# Patient Record
Sex: Male | Born: 1980 | Race: Black or African American | Hispanic: No | Marital: Married | Smoking: Never smoker
Health system: Southern US, Community
[De-identification: ages and names within clinical notes are randomized; demographics above are authoritative.]

## PROBLEM LIST (undated history)

## (undated) DIAGNOSIS — Q6602 Congenital talipes equinovarus, left foot: Secondary | ICD-10-CM

## (undated) DIAGNOSIS — H3320 Serous retinal detachment, unspecified eye: Secondary | ICD-10-CM

## (undated) DIAGNOSIS — Q6601 Congenital talipes equinovarus, right foot: Secondary | ICD-10-CM

## (undated) DIAGNOSIS — K219 Gastro-esophageal reflux disease without esophagitis: Secondary | ICD-10-CM

## (undated) DIAGNOSIS — Q6689 Other  specified congenital deformities of feet: Secondary | ICD-10-CM

## (undated) DIAGNOSIS — R42 Dizziness and giddiness: Secondary | ICD-10-CM

## (undated) HISTORY — PX: EYE SURGERY: SHX253

## (undated) HISTORY — PX: FOOT SURGERY: SHX648

## (undated) HISTORY — DX: Gastro-esophageal reflux disease without esophagitis: K21.9

---

## 2008-09-02 ENCOUNTER — Ambulatory Visit: Payer: Self-pay | Admitting: Gastroenterology

## 2011-01-06 ENCOUNTER — Emergency Department: Payer: Self-pay | Admitting: Emergency Medicine

## 2013-09-30 ENCOUNTER — Ambulatory Visit: Payer: Self-pay | Admitting: Family Medicine

## 2013-10-08 ENCOUNTER — Ambulatory Visit: Payer: Self-pay

## 2014-09-08 ENCOUNTER — Encounter: Payer: Self-pay | Admitting: Emergency Medicine

## 2014-09-08 ENCOUNTER — Ambulatory Visit
Admission: EM | Admit: 2014-09-08 | Discharge: 2014-09-08 | Disposition: A | Discharge status: Home / Self Care | Payer: BLUE CROSS/BLUE SHIELD | Attending: Family Medicine | Admitting: Family Medicine

## 2014-09-08 DIAGNOSIS — K047 Periapical abscess without sinus: Secondary | ICD-10-CM | POA: Diagnosis not present

## 2014-09-08 HISTORY — DX: Serous retinal detachment, unspecified eye: H33.20

## 2014-09-08 MED ORDER — AMOXICILLIN 500 MG PO CAPS: 500.0000 mg | ORAL_CAPSULE | Freq: Three times a day (TID) | ORAL | Status: DC

## 2014-09-08 NOTE — ED Provider Notes (Addendum)
CSN: [id number]     Arrival date & time 09/08/14  1401 History   First MD Initiated Contact with Patient 09/08/14 1432     Chief Complaint  Patient presents with  . Facial Swelling    right ear  . Otalgia   (Consider location/radiation/quality/duration/timing/severity/associated sxs/prior Treatment) HPI  Last week noticed R lower wisdom tooth pain, still somewhat painful, 3d ago noted swelling at base of earlobe, better today but now pain and swelling at R jaw angle. No fever or trouble swallowing. Past Medical History  Diagnosis Date  . Detached retina     right eye   Past Surgical History  Procedure Laterality Date  . Eye surgery     History reviewed. No pertinent family history. History  Substance Use Topics  . Smoking status: Never Smoker   . Smokeless tobacco: Never Used  . Alcohol Use: No    Review of Systems  Allergies  Review of patient's allergies indicates no known allergies.  Home Medications   Prior to Admission medications   Medication Sig Start Date End Date Taking? Authorizing Provider  omeprazole (PRILOSEC) 20 MG capsule Take 20 mg by mouth daily.   Yes Historical Provider, MD  amoxicillin (AMOXIL) 500 MG capsule Take 1 capsule (500 mg total) by mouth 3 (three) times daily. 09/08/14   Schuyler Amor, MD   BP 138/77 mmHg  Pulse 77  Temp(Src) 97.5 F (36.4 C) (Tympanic)  Resp 16  Ht  (1.753 m)  Wt 205 lb (92.987 kg)  BMI 30.26 kg/m2  SpO2 99% Physical Exam  Constitutional: He appears well-developed and well-nourished. No distress.  HENT:  Head: Normocephalic and atraumatic.  Right Ear: External ear normal.  Mouth/Throat: Oropharynx is clear and moist.  Moderately tender adenopathy R jaw angle, minimal edema, R lower molar with deep carie  Neck: Neck supple.  Skin: He is not diaphoretic.    ED Course  Procedures (including critical care time) Labs Review Labs Reviewed - No data to display  Imaging Review No results found.   MDM    1. Dental infection    Amox  tid x 10 days, needs to see dentist for definitive treatment, call if sxs worsen or unimproved in 24 hrs.  Schuyler Amor, MD 09/08/14 1443  Schuyler Amor, MD 09/08/14 [phone]

## 2014-09-08 NOTE — ED Notes (Signed)
Patient c/o right ear swelling and pain on the outside and behind his right ear for the past couple of days.

## 2014-09-08 NOTE — Discharge Instructions (Signed)

## 2015-06-03 ENCOUNTER — Ambulatory Visit
Admission: EM | Admit: 2015-06-03 | Discharge: 2015-06-03 | Disposition: A | Discharge status: Home / Self Care | Attending: Family Medicine | Admitting: Family Medicine

## 2015-06-03 ENCOUNTER — Encounter: Payer: Self-pay | Admitting: *Deleted

## 2015-06-03 DIAGNOSIS — S39012A Strain of muscle, fascia and tendon of lower back, initial encounter: Secondary | ICD-10-CM

## 2015-06-03 MED ORDER — METAXALONE 800 MG PO TABS: 800.0000 mg | ORAL_TABLET | Freq: Three times a day (TID) | ORAL | Status: DC

## 2015-06-03 MED ORDER — NAPROXEN 500 MG PO TABS: 500.0000 mg | ORAL_TABLET | Freq: Two times a day (BID) | ORAL | Status: DC

## 2015-06-03 NOTE — ED Provider Notes (Signed)
CSN: [id number]     Arrival date & time 06/03/15  1554 History   First MD Initiated Contact with Patient 06/03/15 1707     Chief Complaint  Patient presents with  . 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 He states that he had a heavy box in his posterior bag and as he was approaching the delivery destination twisted to his right and extended his back slightly when he felt a pull in his back. He continued to work but as he did he notice the pain became worse and started to radiate into his right buttock and proximal right thigh. He also had some pain feeling in the left lower back over the top of his buttock. There was no radiation of the left side. He has not expressed any numbness or tingling into his feet and he denies any incontinence. He states that he has hurt his back in the past but always improved without any intervention..     Past Medical History  Diagnosis Date  . Detached retina     right eye   Past Surgical History  Procedure Laterality Date  . Eye surgery     History reviewed. No pertinent family history. Social History  Substance Use Topics  . Smoking status: Never Smoker   . Smokeless tobacco: Never Used  . Alcohol Use: No    Review of Systems  Constitutional: Positive for activity change. Negative for fever, chills, appetite change and fatigue.  Musculoskeletal: Positive for myalgias, back pain and gait problem.    Allergies  Review of patient's allergies indicates no known allergies.  Home Medications   Prior to Admission medications   Medication Sig Start Date End Date Taking? Authorizing Provider  amoxicillin (AMOXIL) 500 MG capsule Take 1 capsule (500 mg total) by mouth 3 (three) times daily. 09/08/14   Schuyler Amor, MD  metaxalone (SKELAXIN) 800 MG tablet Take 1 tablet (800 mg total)  by mouth 3 (three) times daily. 06/03/15   Lutricia Feil, PA-C  naproxen (NAPROSYN) 500 MG tablet Take 1 tablet (500 mg total) by mouth 2 (two) times daily with a meal. 06/03/15   Lutricia Feil, PA-C  omeprazole (PRILOSEC) 20 MG capsule Take 20 mg by mouth daily.    Historical Provider, MD   Meds Ordered and Administered this Visit  Medications - No data to display  BP 128/82 mmHg  Pulse 90  Temp(Src) 98.4 F (36.9 C) (Oral)  Resp 18  Ht  (1.753 m)  Wt 195 lb (88.451 kg)  BMI 28.78 kg/m2  SpO2 98% No data found.   Physical Exam  Constitutional: He is oriented to person, place, and time. He appears well-developed and well-nourished. No distress.  HENT:  Head: Normocephalic and atraumatic.  Eyes: Conjunctivae are normal. Pupils are equal, round, and reactive to light.  Neck: Normal range of motion. Neck supple.  Musculoskeletal: Normal range of motion. He exhibits tenderness. He exhibits no edema.  Examination of the back shows a pelvic obliquity to the right. Is able to forward flex fully and lateral flex fully but with the discomfort on the right lower lumbar/sacral segments. Is able to toe and heel walk adequately. His previous left foot repair showing some disequilibrium because of that. Sensation shows a slight hip esthesia in a stocking distribution on the right. EHL peroneal and anterior tibialis are all strong to clinical  testing. Straight leg raise testing is positive at 90 on the right with low back pain.  Lymphadenopathy:    He has no cervical adenopathy.  Neurological: He is alert and oriented to person, place, and time.  Skin: Skin is warm and dry. He is not diaphoretic.  Psychiatric: He has a normal mood and affect. His behavior is normal. Judgment and thought content normal.  Nursing note and vitals reviewed.   ED Course  Procedures (including critical care time)  Labs Review Labs Reviewed - No data to display  Imaging Review No results  found.   Visual Acuity Review  Right Eye Distance:   Left Eye Distance:   Bilateral Distance:    Right Eye Near:   Left Eye Near:    Bilateral Near:         MDM   1. Strain of lumbar paraspinal muscle, initial encounter    Discharge Medication List as of 06/03/2015  5:37 PM    START taking these medications   Details  metaxalone (SKELAXIN) 800 MG tablet Take 1 tablet (800 mg total) by mouth 3 (three) times daily., Starting 06/03/2015, Until Discontinued, Normal    naproxen (NAPROSYN) 500 MG tablet Take 1 tablet (500 mg total) by mouth 2 (two) times daily with a meal., Starting 06/03/2015, Until Discontinued, Normal      Plan: 1. Test/x-ray results and diagnosis reviewed with patient 2. rx as per orders; risks, benefits, potential side effects reviewed with patient 3. Recommend supportive treatment with Symptom avoidance and rest as necessary. We'll start him on Naprosyn and Skelaxin. The Skelaxin may cause drowsiness I've cautioned him regarding activities requiring judgment and concentration. He should not drive while taking Skelaxin. He will take these well home and time. Follow-up for continuing problems will be with Lynnell Chadommie Ann Moore,FNP [facility]. Given him restrictions at work for 1 week duration of the sitting lifting or bending avoidance. He should not lift greater than 10 pounds. Have offered him aToradol injection which he refused. 4. F/u prn if symptoms worsen or don't improve     Lutricia FeilWilliam P Samrat Hayward, PA-C 06/03/15 1752

## 2015-06-03 NOTE — ED Notes (Signed)
Patient injured his back this AM @ 1100 while at work. Patient was lifting a box and twisted his back causing some pain in his lower on the right side. As the workday progressed the back pain became worse and started hurting on the left side.

## 2015-06-03 NOTE — Discharge Instructions (Signed)
Back Injury Prevention °Back injuries can be very painful. They can also be difficult to heal. After having one back injury, you are more likely to injure your back again. It is important to learn how to avoid injuring or re-injuring your back. The following tips can help you to prevent a back injury. °WHAT SHOULD I KNOW ABOUT PHYSICAL FITNESS? °· Exercise for 30 minutes per day on most days of the week or as directed by your health care provider. Make sure to: °· Do aerobic exercises, such as walking, jogging, biking, or swimming. °· Do exercises that increase balance and strength, such as tai chi and yoga. These can decrease your risk of falling and injuring your back. °· Do stretching exercises to help with flexibility. °· Try to develop strong abdominal muscles. Your abdominal muscles provide a lot of the support that is needed by your back. °· Maintain a healthy weight.  This helps to decrease your risk of a back injury. °WHAT SHOULD I KNOW ABOUT MY DIET? °· Talk with your health care provider about your overall diet. Take supplements and vitamins only as directed by your health care provider. °· Talk with your health care provider about how much calcium and vitamin D you need each day. These nutrients help to prevent weakening of the bones (osteoporosis). Osteoporosis can cause broken (fractured) bones, which lead to back pain. °· Include good sources of calcium in your diet, such as dairy products, green leafy vegetables, and products that have had calcium added to them (fortified). °· Include good sources of vitamin D in your diet, such as milk and foods that are fortified with vitamin D. °WHAT SHOULD I KNOW ABOUT MY POSTURE? °· Sit up straight and stand up straight. Avoid leaning forward when you sit or hunching over when you stand. °· Choose chairs that have good low-back (lumbar) support. °· If you work at a desk, sit close to it so you do not need to lean over. Keep your chin tucked in. Keep your neck  drawn back, and keep your elbows bent at a right angle. Your arms should look like the letter "L." °· Sit high and close to the steering wheel when you drive. Add a lumbar support to your car seat, if needed. °· Avoid sitting or standing in one position for very long. Take breaks to get up, stretch, and walk around at least one time every hour. Take breaks every hour if you are driving for long periods of time. °· Sleep on your side with your knees slightly bent, or sleep on your back with a pillow under your knees. Do not lie on the front of your body to sleep. °WHAT SHOULD I KNOW ABOUT LIFTING, TWISTING, AND REACHING? °Lifting and Heavy Lifting °· Avoid heavy lifting, especially repetitive heavy lifting. If you must do heavy lifting: °· Stretch before lifting. °· Work slowly. °· Rest between lifts. °· Use a tool such as a cart or a dolly to move objects if one is available. °· Make several small trips instead of carrying one heavy load. °· Ask for help when you need it, especially when moving big objects. °· Follow these steps when lifting: °· Stand with your feet shoulder-width apart. °· Get as close to the object as you can. Do not try to pick up a heavy object that is far from your body. °· Use handles or lifting straps if they are available. °· Bend at your knees. Squat down, but keep your heels off the floor. °·   Keep your shoulders pulled back, your chin tucked in, and your back straight. °· Lift the object slowly while you tighten the muscles in your legs, abdomen, and buttocks. Keep the object as close to the center of your body as possible. °· Follow these steps when putting down a heavy load: °· Stand with your feet shoulder-width apart. °· Lower the object slowly while you tighten the muscles in your legs, abdomen, and buttocks. Keep the object as close to the center of your body as possible. °· Keep your shoulders pulled back, your chin tucked in, and your back straight. °· Bend at your knees. Squat  down, but keep your heels off the floor. °· Use handles or lifting straps if they are available. °Twisting and Reaching °· Avoid lifting heavy objects above your waist. °· Do not twist at your waist while you are lifting or carrying a load. If you need to turn, move your feet. °· Do not bend over without bending at your knees. °· Avoid reaching over your head, across a table, or for an object on a high surface. °WHAT ARE SOME OTHER TIPS? °· Avoid wet floors and icy ground. Keep sidewalks clear of ice to prevent falls. °· Do not sleep on a mattress that is too soft or too hard. °· Keep items that are used frequently within easy reach. °· Put heavier objects on shelves at waist level, and put lighter objects on lower or higher shelves. °· Find ways to decrease your stress, such as exercise, massage, or relaxation techniques. Stress can build up in your muscles. Tense muscles are more vulnerable to injury. °· Talk with your health care provider if you feel anxious or depressed. These conditions can make back pain worse. °· Wear flat heel shoes with cushioned soles. °· Avoid sudden movements. °· Use both shoulder straps when carrying a backpack. °· Do not use any tobacco products, including cigarettes, chewing tobacco, or electronic cigarettes. If you need help quitting, ask your health care provider. °  °This information is not intended to replace advice given to you by your health care provider. Make sure you discuss any questions you have with your health care provider. °  °Document Released: 03/02/2004 Document Revised: 06/09/2014 Document Reviewed: 01/27/2014 °Elsevier Interactive Patient Education ©2016 [employer] ° °Lumbosacral Strain °Lumbosacral strain is a strain of any of the parts that make up your lumbosacral vertebrae. Your lumbosacral vertebrae are the bones that make up the lower third of your backbone. Your lumbosacral vertebrae are held together by muscles and tough, fibrous tissue (ligaments).    °CAUSES  °A sudden blow to your back can cause lumbosacral strain. Also, anything that causes an excessive stretch of the muscles in the low back can cause this strain. This is typically seen when people exert themselves strenuously, fall, lift heavy objects, bend, or crouch repeatedly. °RISK FACTORS °· Physically demanding work. °· Participation in pushing or pulling sports or sports that require a sudden twist of the back (tennis, golf, baseball). °· Weight lifting. °· Excessive lower back curvature. °· Forward-tilted pelvis. °· Weak back or abdominal muscles or both. °· Tight hamstrings. °SIGNS AND SYMPTOMS  °Lumbosacral strain may cause pain in the area of your injury or pain that moves (radiates) down your leg.  °DIAGNOSIS °Your health care provider can often diagnose lumbosacral strain through a physical exam. In some cases, you may need tests such as X-ray exams.  °TREATMENT  °Treatment for your lower back injury depends on many factors that   your clinician will have to evaluate. However, most treatment will include the use of anti-inflammatory medicines. °HOME CARE INSTRUCTIONS  °· Avoid hard physical activities (tennis, racquetball, waterskiing) if you are not in proper physical condition for it. This may aggravate or create problems. °· If you have a back problem, avoid sports requiring sudden body movements. Swimming and walking are generally safer activities. °· Maintain good posture. °· Maintain a healthy weight. °· For acute conditions, you may put ice on the injured area. °¨ Put ice in a plastic bag. °¨ Place a towel between your skin and the bag. °¨ Leave the ice on for 20 minutes, 2-3 times a day. °· When the low back starts healing, stretching and strengthening exercises may be recommended. °SEEK MEDICAL CARE IF: °· Your back pain is getting worse. °· You experience severe back pain not relieved with medicines. °SEEK IMMEDIATE MEDICAL CARE IF:  °· You have numbness, tingling, weakness, or problems  with the use of your arms or legs. °· There is a change in bowel or bladder control. °· You have increasing pain in any area of the body, including your belly (abdomen). °· You notice shortness of breath, dizziness, or feel faint. °· You feel sick to your stomach (nauseous), are throwing up (vomiting), or become sweaty. °· You notice discoloration of your toes or legs, or your feet get very cold. °MAKE SURE YOU:  °· Understand these instructions. °· Will watch your condition. °· Will get help right away if you are not doing well or get worse. °  °This information is not intended to replace advice given to you by your health care provider. Make sure you discuss any questions you have with your health care provider. °  °Document Released: 11/02/2004 Document Revised: 02/13/2014 Document Reviewed: 09/11/2012 °Elsevier Interactive Patient Education ©2016 [employer] ° °

## 2015-06-04 ENCOUNTER — Ambulatory Visit: Admission: EM | Admit: 2015-06-04 | Discharge: 2015-06-04 | Disposition: A | Discharge status: Home / Self Care

## 2015-06-04 ENCOUNTER — Encounter: Payer: Self-pay | Admitting: Emergency Medicine

## 2015-06-04 NOTE — ED Notes (Signed)
Patient c/o pain in his back that has gotten worse.  Patient was seen yesterday for a work related injury yesterday/

## 2016-12-12 ENCOUNTER — Other Ambulatory Visit: Payer: Self-pay

## 2016-12-12 ENCOUNTER — Ambulatory Visit
Admission: EM | Admit: 2016-12-12 | Discharge: 2016-12-12 | Disposition: A | Discharge status: Home / Self Care | Payer: BLUE CROSS/BLUE SHIELD | Attending: Family Medicine | Admitting: Family Medicine

## 2016-12-12 DIAGNOSIS — J0100 Acute maxillary sinusitis, unspecified: Secondary | ICD-10-CM | POA: Diagnosis not present

## 2016-12-12 MED ORDER — FLUTICASONE PROPIONATE 50 MCG/ACT NA SUSP
2.0000 | Freq: Every day | NASAL | 0 refills | Status: DC
Start: 2016-12-12 — End: 2017-04-14

## 2016-12-12 MED ORDER — AMOXICILLIN-POT CLAVULANATE 875-125 MG PO TABS: 1.0000 | ORAL_TABLET | Freq: Two times a day (BID) | ORAL | 0 refills | Status: DC

## 2016-12-12 NOTE — ED Triage Notes (Signed)
Pt with green/brown secretions from nose. Facial pain/teeth pain/head pain. Sx x 2 weeks. No fever

## 2016-12-12 NOTE — ED Provider Notes (Signed)
[facility]    CSN: [id number] Arrival date & time: 12/12/16  1548     History   Chief Complaint Chief Complaint  Patient presents with  . Facial Pain    HPI Ryan Sawyer is a 36 y.o. male.   HPI  This is a 36 year old male who presents with a two-week history of facial pain teeth pain and headache along with greenish brown secretions from his nose. He has had no history of sinusitis in the past. He has had no fever or chills. Is been taking Mucinex over-the-counter but this has not been helping.         Past Medical History:  Diagnosis Date  . Detached retina    right eye    There are no active problems to display for this patient.   Past Surgical History:  Procedure Laterality Date  . EYE SURGERY    . FOOT SURGERY         Home Medications    Prior to Admission medications   Medication Sig Start Date End Date Taking? Authorizing Provider  amoxicillin-clavulanate (AUGMENTIN) 875-125 MG tablet Take 1 tablet every 12 (twelve) hours by mouth. 12/12/16   Lutricia Feil, PA-C  fluticasone (FLONASE) 50 MCG/ACT nasal spray Place 2 sprays daily into both nostrils. 12/12/16   Lutricia Feil, PA-C    Family History History reviewed. No pertinent family history.  Social History Social History   Tobacco Use  . Smoking status: Never Smoker  . Smokeless tobacco: Never Used  Substance Use Topics  . Alcohol use: No  . Drug use: No     Allergies   Patient has no known allergies.   Review of Systems Review of Systems  Constitutional: Positive for activity change. Negative for appetite change, chills, fatigue and fever.  HENT: Positive for congestion, postnasal drip, sinus pressure and sinus pain.   Respiratory: Positive for cough.   All other systems reviewed and are negative.    Physical Exam Triage Vital Signs ED Triage Vitals  Enc Vitals Group     BP 12/12/16 1601 136/85     Pulse Rate 12/12/16 1601 66     Resp 12/12/16 1601  16     Temp 12/12/16 1601 98.5 F (36.9 C)     Temp Source 12/12/16 1601 Oral     SpO2 12/12/16 1601 100 %     Weight 12/12/16 1601 210 lb (95.3 kg)     Height 12/12/16 1601 5\' 9"  (1.753 m)     Head Circumference --      Peak Flow --      Pain Score 12/12/16 1603 8     Pain Loc --      Pain Edu? --      Excl. in GC? --    No data found.  Updated Vital Signs BP 136/85 (BP Location: Left Arm)   Pulse 66   Temp 98.5 F (36.9 C) (Oral)   Resp 16   Ht 5\' 9"  (1.753 m)   Wt 210 lb (95.3 kg)   SpO2 100%   BMI 31.01 kg/m   Visual Acuity Right Eye Distance:   Left Eye Distance:   Bilateral Distance:    Right Eye Near:   Left Eye Near:    Bilateral Near:     Physical Exam  Constitutional: He is oriented to person, place, and time. He appears well-developed and well-nourished. No distress.  HENT:  Head: Normocephalic.  Right Ear: External ear normal.  Left Ear: External ear normal.  Nose: Nose normal.  Mouth/Throat: Oropharynx is clear and moist. No oropharyngeal exudate.  PAtient has a tenderness to percussion over the maxillary sinuses  Eyes: EOM are normal. Pupils are equal, round, and reactive to light. Right eye exhibits no discharge. Left eye exhibits no discharge.  Neck: Normal range of motion.  Pulmonary/Chest: Effort normal and breath sounds normal.  Musculoskeletal: Normal range of motion.  Lymphadenopathy:    He has no cervical adenopathy.  Neurological: He is alert and oriented to person, place, and time.  Skin: Skin is warm and dry. He is not diaphoretic.  Psychiatric: He has a normal mood and affect. His behavior is normal. Judgment and thought content normal.  Nursing note and vitals reviewed.    UC Treatments / Results  Labs (all labs ordered are listed, but only abnormal results are displayed) Labs Reviewed - No data to display  EKG  EKG Interpretation None       Radiology No results found.  Procedures Procedures (including critical care  time)  Medications Ordered in UC Medications - No data to display   Initial Impression / Assessment and Plan / UC Course  I have reviewed the triage vital signs and the nursing notes.  Pertinent labs & imaging results that were available during my care of the patient were reviewed by me and considered in my medical decision making (see chart for details).     .Plan: 1. Test/x-ray results and diagnosis reviewed with patient 2. rx as per orders; risks, benefits, potential side effects reviewed with patient 3. Recommend supportive treatment with use of Flonase on a daily basis for the next 2-3 weeks. Start him on Augmentin. Also recommended an Netty pot as an adjunct to 2 using the Flonase. He is not improving he may return to us or to a primary care physician of his choice 4. F/u prn if symptoms worsen or don't improve   Final Clinical Impressions(s) / UC Diagnoses   Final diagnoses:  Acute non-recurrent maxillary sinusitis    ED Discharge Orders        Ordered    amoxicillin-clavulanate (AUGMENTIN) 875-125 MG tablet  Every 12 hours     12/12/16 1705    fluticasone (FLONASE) 50 MCG/ACT nasal spray  Daily     12/12/16 1705       Controlled Substance Prescriptions [place] Controlled Substance Registry consulted? Not Applicable   Lutricia FeilRoemer, Shawnda Mauney P, PA-C 12/12/16 1713

## 2016-12-22 ENCOUNTER — Encounter: Payer: Self-pay | Admitting: Family Medicine

## 2016-12-22 ENCOUNTER — Ambulatory Visit: Payer: BLUE CROSS/BLUE SHIELD | Admitting: Family Medicine

## 2016-12-22 VITALS — BP 121/79 | HR 77 | Temp 98.6°F | Ht 69.0 in | Wt 217.3 lb

## 2016-12-22 DIAGNOSIS — K0889 Other specified disorders of teeth and supporting structures: Secondary | ICD-10-CM

## 2016-12-22 MED ORDER — CLINDAMYCIN HCL 150 MG PO CAPS: 150.0000 mg | ORAL_CAPSULE | Freq: Two times a day (BID) | ORAL | 0 refills | Status: DC

## 2016-12-22 NOTE — Progress Notes (Signed)
   BP 121/79 (BP Location: Right Arm, Patient Position: Sitting, Cuff Size: Large)   Pulse 77   Temp 98.6 F (37 C) (Oral)   Ht 5\' 9"  (1.753 m)   Wt 217 lb 4.8 oz (98.6 kg)   SpO2 98%   BMI 32.09 kg/m    Subjective:    Patient ID: Ryan Sawyer, male    DOB: 03/19/1980, 36 y.o.   MRN: [id number]  HPI: Ryan Sawyer is a 36 y.o. male  Chief Complaint  Patient presents with  . Dental Pain    Bump above tooth on left side of mouth. Was treated for sinus infection 12/12/2016   Patient presents today for gumline pain and a "bump" above an upper left molar. Went to UC last week for b/l tooth pain, given augmentin and flonase for sinusitis. Nearly finished with that course and now this localized pain has started. The pain causes a headache and neck pain. Denies fevers, chills, redness, drainage, facial swelling. Does have a dentist, last check up was about a year ago.   Relevant past medical, surgical, family and social history reviewed and updated as indicated. Interim medical history since our last visit reviewed. Allergies and medications reviewed and updated.  Review of Systems  Constitutional: Negative.   HENT: Positive for dental problem.   Respiratory: Negative.   Cardiovascular: Negative.   Gastrointestinal: Negative.   Musculoskeletal: Negative.   Neurological: Negative.   Psychiatric/Behavioral: Negative.    Per HPI unless specifically indicated above     Objective:    BP 121/79 (BP Location: Right Arm, Patient Position: Sitting, Cuff Size: Large)   Pulse 77   Temp 98.6 F (37 C) (Oral)   Ht 5\' 9"  (1.753 m)   Wt 217 lb 4.8 oz (98.6 kg)   SpO2 98%   BMI 32.09 kg/m   Wt Readings from Last 3 Encounters:  12/22/16 217 lb 4.8 oz (98.6 kg)  12/12/16 210 lb (95.3 kg)  06/04/15 195 lb (88.5 kg)    Physical Exam  Constitutional: He is oriented to person, place, and time. He appears well-developed and well-nourished. No distress.  HENT:  Head: Atraumatic.  Right  Ear: External ear normal.  Left Ear: External ear normal.  Nose: Nose normal.  Mouth/Throat: Oropharynx is clear and moist.  Firm non-discolored protrusion at gumline in area of patient's discomfort. Does not appear actively infected, no pain with palpation of correlating tooth.   Eyes: Conjunctivae are normal. Pupils are equal, round, and reactive to light. No scleral icterus.  Neck: Normal range of motion. Neck supple.  Cardiovascular: Normal rate and normal heart sounds.  Pulmonary/Chest: Effort normal and breath sounds normal. No respiratory distress.  Musculoskeletal: Normal range of motion.  Neurological: He is alert and oriented to person, place, and time.  Skin: Skin is warm and dry.  Psychiatric: He has a normal mood and affect. His behavior is normal.  Nursing note and vitals reviewed.  No results found for this or any previous visit.    Assessment & Plan:   Problem List Items Addressed This Visit    None    Visit Diagnoses    Pain, dental    -  Primary   Will start clindamycin in case early abscess forming. Salt water gargles, brush teeth and floss well regularly, f/u with dentist ASAP       Follow up plan: Return for CPE.

## 2016-12-25 NOTE — Patient Instructions (Signed)
Follow up for CPE 

## 2017-04-14 ENCOUNTER — Ambulatory Visit
Admission: EM | Admit: 2017-04-14 | Discharge: 2017-04-14 | Disposition: A | Discharge status: Home / Self Care | Payer: BLUE CROSS/BLUE SHIELD | Attending: Family Medicine | Admitting: Family Medicine

## 2017-04-14 ENCOUNTER — Other Ambulatory Visit: Payer: Self-pay

## 2017-04-14 DIAGNOSIS — J029 Acute pharyngitis, unspecified: Secondary | ICD-10-CM

## 2017-04-14 DIAGNOSIS — J040 Acute laryngitis: Secondary | ICD-10-CM

## 2017-04-14 LAB — RAPID STREP SCREEN (MED CTR MEBANE ONLY): Streptococcus, Group A Screen (Direct): NEGATIVE

## 2017-04-14 MED ORDER — OMEPRAZOLE 20 MG PO CPDR: 20.0000 mg | DELAYED_RELEASE_CAPSULE | Freq: Every day | ORAL | Status: AC

## 2017-04-14 NOTE — ED Triage Notes (Signed)
Patient c/o sore throat x couple days 

## 2017-04-14 NOTE — ED Provider Notes (Signed)
[facility]    CSN: [id number] Arrival date & time: 3/9/[id number]  1558  History   Chief Complaint Chief Complaint  Patient presents with  . Sore Throat   HPI  37 year old male presents with hoarseness.  Patient has known gastroesophageal reflux.  He states that he takes omeprazole every other day.  He states that for the past 2 days he has had hoarseness.  He states that his throat has been dry but he denies actual sore throat.  No fevers or chills.  No other upper respiratory symptoms.  He endorses compliance with his omeprazole.  No known exacerbating relieving factors.  No other associated symptoms.  No other complaints.  Past Medical History:  Diagnosis Date  . Detached retina    right eye  . GERD (gastroesophageal reflux disease)    Past Surgical History:  Procedure Laterality Date  . EYE SURGERY    . FOOT SURGERY     Home Medications    Prior to Admission medications   Medication Sig Start Date End Date Taking? Authorizing Provider  omeprazole (PRILOSEC) 20 MG capsule Take 1 capsule (20 mg total) by mouth daily. 04/14/17   Tommie Samsook, Orel Hord G, DO   Family History Family History  Problem Relation Age of Onset  . Diabetes Mother   . Hyperlipidemia Mother   . Hypertension Mother    Social History Social History   Tobacco Use  . Smoking status: Never Smoker  . Smokeless tobacco: Never Used  Substance Use Topics  . Alcohol use: No  . Drug use: No   Allergies   Patient has no known allergies.   Review of Systems Review of Systems  Constitutional: Negative.   HENT: Positive for voice change.    Physical Exam Triage Vital Signs ED Triage Vitals  Enc Vitals Group     BP 04/14/17 1610 129/76     Pulse Rate 04/14/17 1610 78     Resp --      Temp 04/14/17 1610 98.6 F (37 C)     Temp Source 04/14/17 1610 Oral     SpO2 04/14/17 1610 99 %     Weight 04/14/17 1609 215 lb (97.5 kg)     Height 04/14/17 1609 5\' 9"  (1.753 m)     Head Circumference --    Peak Flow --      Pain Score 04/14/17 1609 9     Pain Loc --      Pain Edu? --      Excl. in GC? --    Updated Vital Signs BP 129/76 (BP Location: Left Arm)   Pulse 78   Temp 98.6 F (37 C) (Oral)   Ht 5\' 9"  (1.753 m)   Wt 215 lb (97.5 kg)   SpO2 99%   BMI 31.75 kg/m     Physical Exam  Constitutional: He is oriented to person, place, and time. He appears well-developed. No distress.  HENT:  Head: Normocephalic and atraumatic.  Mouth/Throat: Oropharynx is clear and moist.  Eyes: Conjunctivae are normal. Right eye exhibits no discharge. Left eye exhibits no discharge.  Cardiovascular: Normal rate and regular rhythm.  Pulmonary/Chest: Effort normal and breath sounds normal. He has no wheezes. He has no rales.  Neurological: He is oriented to person, place, and time.  Psychiatric: He has a normal mood and affect. His behavior is normal.  Nursing note and vitals reviewed.  UC Treatments / Results  Labs (all labs ordered are listed, but only abnormal results  are displayed) Labs Reviewed  RAPID STREP SCREEN (NOT AT [facility])  CULTURE, GROUP A STREP [facility])    EKG  EKG Interpretation None       Radiology No results found.  Procedures Procedures (including critical care time)  Medications Ordered in UC Medications - No data to display   Initial Impression / Assessment and Plan / UC Course  I have reviewed the triage vital signs and the nursing notes.  Pertinent labs & imaging results that were available during my care of the patient were reviewed by me and considered in my medical decision making (see chart for details).     37 year old male presents with laryngitis. Likely viral.  Advised supportive care.  Possible component of GERD.  Advised to take omeprazole daily.  Final Clinical Impressions(s) / UC Diagnoses   Final diagnoses:  Laryngitis    ED Discharge Orders        Ordered    omeprazole (PRILOSEC) 20 MG capsule  Daily     04/14/17 1706      Controlled Substance Prescriptions  Controlled Substance Registry consulted? Not Applicable   Tommie Sams, DO 04/14/17 1719

## 2017-04-17 LAB — CULTURE, GROUP A STREP (THRC)

## 2017-08-15 ENCOUNTER — Encounter: Payer: Self-pay | Admitting: Emergency Medicine

## 2017-08-15 ENCOUNTER — Ambulatory Visit
Admission: EM | Admit: 2017-08-15 | Discharge: 2017-08-15 | Disposition: A | Discharge status: Home / Self Care | Payer: BLUE CROSS/BLUE SHIELD | Attending: Family Medicine | Admitting: Family Medicine

## 2017-08-15 ENCOUNTER — Other Ambulatory Visit: Payer: Self-pay

## 2017-08-15 DIAGNOSIS — H8111 Benign paroxysmal vertigo, right ear: Secondary | ICD-10-CM | POA: Diagnosis not present

## 2017-08-15 HISTORY — DX: Congenital talipes equinovarus, left foot: Q66.02

## 2017-08-15 HISTORY — DX: Congenital talipes equinovarus, right foot: Q66.01

## 2017-08-15 HISTORY — DX: Other specified congenital deformities of feet: Q66.89

## 2017-08-15 MED ORDER — MECLIZINE HCL 25 MG PO TABS: 25.0000 mg | ORAL_TABLET | Freq: Three times a day (TID) | ORAL | 0 refills | Status: DC | PRN

## 2017-08-15 NOTE — ED Triage Notes (Signed)
Patient c/ dizziness and vertigo that started last night. He reports this morning he progressively got worse. Does report sinus drainage and allergies that he experienced last week. Reports nausea, denies vomiting.

## 2017-08-15 NOTE — Discharge Instructions (Signed)
Medication as directed.  Rest.  Try the maneuver.  Take care  Dr. Adriana Simasook

## 2017-08-15 NOTE — ED Triage Notes (Signed)
Patient also reports starting the Keto diet about 1 month ago.

## 2017-08-15 NOTE — ED Provider Notes (Signed)
[facility]    CSN: [id number] Arrival date & time: 08/15/17  1032  History   Chief Complaint Chief Complaint  Patient presents with  . Dizziness    HPI   37 year old male presents with dizziness.  Patient reports that his dizziness started yesterday.  He states that he has been dizzy and lightheaded.  He states he has been unsteady on his feet.  Associated nausea.  He states that it feels like things are spinning.  Worse with abrupt movements and getting up.  No relieving factors.  He states it is intermittent and lasts for minutes.  Some improvement with rest but seems to still occur at rest.  No other associated symptoms.  No other complaints or concerns at this time.  Past Medical History:  Diagnosis Date  . Club foot of both lower extremities   . Detached retina    right eye  . GERD (gastroesophageal reflux disease)    Past Surgical History:  Procedure Laterality Date  . EYE SURGERY    . FOOT SURGERY     Home Medications    Prior to Admission medications   Medication Sig Start Date End Date Taking? Authorizing Provider  omeprazole (PRILOSEC) 20 MG capsule Take 1 capsule (20 mg total) by mouth daily. 04/14/17  Yes Sanjna Haskew G, DO  meclizine (ANTIVERT) 25 MG tablet Take 1 tablet (25 mg total) by mouth 3 (three) times daily as needed for dizziness. 08/15/17   Tommie Sams, DO    Family History Family History  Problem Relation Age of Onset  . Diabetes Mother   . Hyperlipidemia Mother   . Hypertension Mother   . Healthy Father     Social History Social History   Tobacco Use  . Smoking status: Never Smoker  . Smokeless tobacco: Never Used  Substance Use Topics  . Alcohol use: No  . Drug use: No     Allergies   Patient has no known allergies.   Review of Systems Review of Systems  Constitutional: Negative.   Gastrointestinal: Positive for nausea.  Neurological: Positive for dizziness and light-headedness.   Physical Exam Triage Vital  Signs ED Triage Vitals  Enc Vitals Group     BP 08/15/17 1045 127/83     Pulse Rate 08/15/17 1045 (!) 58     Resp 08/15/17 1045 18     Temp 08/15/17 1045 98.5 F (36.9 C)     Temp Source 08/15/17 1045 Oral     SpO2 08/15/17 1045 100 %     Weight 08/15/17 1043 205 lb (93 kg)     Height 08/15/17 1043 5\' 9"  (1.753 m)     Head Circumference --      Peak Flow --      Pain Score 08/15/17 1043 0     Pain Loc --      Pain Edu? --      Excl. in GC? --    Orthostatic VS for the past 24 hrs:  BP- Lying Pulse- Lying BP- Sitting Pulse- Sitting BP- Standing at 0 minutes Pulse- Standing at 0 minutes  08/15/17 1048 127/83 58 133/87 80 (!) 135/91 86    Updated Vital Signs BP 127/83 (BP Location: Left Arm)   Pulse (!) 58   Temp 98.5 F (36.9 C) (Oral)   Resp 18   Ht 5\' 9"  (1.753 m)   Wt 205 lb (93 kg)   SpO2 100%   BMI 30.27 kg/m   Visual Acuity Right Eye  Distance:   Left Eye Distance:   Bilateral Distance:    Right Eye Near:   Left Eye Near:    Bilateral Near:     Physical Exam  Constitutional: He is oriented to person, place, and time. He appears well-developed. No distress.  HENT:  Head: Normocephalic and atraumatic.  Eyes: Pupils are equal, round, and reactive to light. Conjunctivae and EOM are normal.  Cardiovascular: Normal rate and regular rhythm.  Pulmonary/Chest: Effort normal and breath sounds normal. He has no wheezes. He has no rales.  Neurological: He is alert and oriented to person, place, and time.  Patient with dizziness with Dix-Hallpike on the right.  No nystagmus was elicited.  Psychiatric: He has a normal mood and affect. His behavior is normal.  Nursing note and vitals reviewed.  UC Treatments / Results  Labs (all labs ordered are listed, but only abnormal results are displayed) Labs Reviewed - No data to display  EKG None  Radiology No results found.  Procedures Procedures (including critical care time)  Medications Ordered in UC Medications  - No data to display  Initial Impression / Assessment and Plan / UC Course  I have reviewed the triage vital signs and the nursing notes.  Pertinent labs & imaging results that were available during my care of the patient were reviewed by me and considered in my medical decision making (see chart for details).    37 year old male presents with BPPV.  Treating with meclizine.  Advised home Epley maneuver.  Supportive care.  Final Clinical Impressions(s) / UC Diagnoses   Final diagnoses:  Benign paroxysmal positional vertigo of right ear     Discharge Instructions     Medication as directed.  Rest.  Try the maneuver.  Take care  Dr. Adriana Simasook    ED Prescriptions    Medication Sig Dispense Auth. Provider   meclizine (ANTIVERT) 25 MG tablet Take 1 tablet (25 mg total) by mouth 3 (three) times daily as needed for dizziness. 30 tablet Tommie Samsook, Allison Silva G, DO     Controlled Substance Prescriptions [place] Controlled Substance Registry consulted? Not Applicable   Tommie SamsCook, Joseh Sjogren G, DO 08/15/17 1223

## 2017-12-24 ENCOUNTER — Encounter: Payer: Self-pay | Admitting: Family Medicine

## 2017-12-24 ENCOUNTER — Ambulatory Visit (INDEPENDENT_AMBULATORY_CARE_PROVIDER_SITE_OTHER): Payer: BLUE CROSS/BLUE SHIELD | Admitting: Family Medicine

## 2017-12-24 VITALS — BP 122/83 | HR 75 | Temp 98.6°F | Wt 214.0 lb

## 2017-12-24 DIAGNOSIS — M6740 Ganglion, unspecified site: Secondary | ICD-10-CM | POA: Insufficient documentation

## 2017-12-24 DIAGNOSIS — R253 Fasciculation: Secondary | ICD-10-CM | POA: Diagnosis not present

## 2017-12-24 DIAGNOSIS — Z130 Encounter for screening for diseases of the blood and blood-forming organs and certain disorders involving the immune mechanism: Secondary | ICD-10-CM

## 2017-12-24 NOTE — Assessment & Plan Note (Signed)
Large ganglion cyst base of right thumb will refer to orthopedics to further evaluate.

## 2017-12-24 NOTE — Progress Notes (Signed)
BP 122/83   Pulse 75   Temp 98.6 F (37 C) (Oral)   Wt 214 lb (97.1 kg)   SpO2 99%   BMI 31.60 kg/m    Subjective:    Patient ID: Ryan Sawyer, male    DOB: 06/08/1980, 37 y.o.   MRN: [id number]  HPI: Ryan Sawyer is a 37 y.o. male  Chief Complaint  Patient presents with  . Cyst    on wrist  . Testing    Wife's pregnant wants sickle cell testing  . Jerking    Left hand, pointer x few years.   Patient with a large ganglion cyst base of his right thumb is been present for is been present for years but now getting bigger and is in the way. Patient's wife is pregnant and wants to get certain things taking care of prior to delivery. Patient also has some twitching of his left index finger in the webspace between his thumb and index finger.  This is nonspecific twitching spasming that does not bother him with activity keyboard work etc. Relevant past medical, surgical, family and social history reviewed and updated as indicated. Interim medical history since our last visit reviewed. Allergies and medications reviewed and updated.  Review of Systems  Constitutional: Negative.   Respiratory: Negative.   Cardiovascular: Negative.     Per HPI unless specifically indicated above     Objective:    BP 122/83   Pulse 75   Temp 98.6 F (37 C) (Oral)   Wt 214 lb (97.1 kg)   SpO2 99%   BMI 31.60 kg/m   Wt Readings from Last 3 Encounters:  12/24/17 214 lb (97.1 kg)  08/15/17 205 lb (93 kg)  04/14/17 215 lb (97.5 kg)    Physical Exam  Constitutional: He is oriented to person, place, and time. He appears well-developed and well-nourished.  HENT:  Head: Normocephalic and atraumatic.  Eyes: Conjunctivae and EOM are normal.  Neck: Normal range of motion.  Cardiovascular: Normal rate, regular rhythm and normal heart sounds.  Pulmonary/Chest: Effort normal and breath sounds normal.  Musculoskeletal: Normal range of motion.  Neurological: He is alert and oriented to person,  place, and time.  Skin: No erythema.  Psychiatric: He has a normal mood and affect. His behavior is normal. Judgment and thought content normal.    Results for orders placed or performed during the hospital encounter of 04/14/17  Rapid strep screen  Result Value Ref Range   Streptococcus, Group A Screen (Direct) NEGATIVE NEGATIVE  Culture, group A strep  Result Value Ref Range   Specimen Description      THROAT Performed at [facility], [address] [place], [place] [address]    Special Requests      NONE Reflexed from [id number] Performed at [facility], [address] [place], [place] [address]    Culture      NO GROUP A STREP (S.PYOGENES) ISOLATED Performed at [facility] [place] [facility], [address] [place], [place] [address]    Report Status 04/17/2017 FINAL       Assessment & Plan:   Problem List Items Addressed This Visit      Other   Ganglion cyst    Large ganglion cyst base of right thumb will refer to orthopedics to further evaluate.      Relevant Orders   Ambulatory referral to Orthopedic Surgery   Muscle twitch    Discussed finger twitching will observe stretching  exercises recommended and strengthening exercises.  Will observe if changes or changes character gets worse will reevaluate.       Other Visit Diagnoses    Screening for sickle-cell disease or trait    -  Primary   Relevant Orders   Sickle cell screen       Follow up plan: Return for Physical Exam.

## 2017-12-24 NOTE — Assessment & Plan Note (Signed)
Discussed finger twitching will observe stretching exercises recommended and strengthening exercises.  Will observe if changes or changes character gets worse will reevaluate.

## 2017-12-25 LAB — SICKLE CELL SCREEN: Sickle Cell Screen: NEGATIVE

## 2017-12-26 ENCOUNTER — Encounter: Payer: Self-pay | Admitting: Family Medicine

## 2018-02-18 ENCOUNTER — Ambulatory Visit
Admission: EM | Admit: 2018-02-18 | Discharge: 2018-02-18 | Disposition: A | Discharge status: Home / Self Care | Payer: BLUE CROSS/BLUE SHIELD | Attending: Family Medicine | Admitting: Family Medicine

## 2018-02-18 DIAGNOSIS — R05 Cough: Secondary | ICD-10-CM | POA: Diagnosis not present

## 2018-02-18 DIAGNOSIS — J029 Acute pharyngitis, unspecified: Secondary | ICD-10-CM

## 2018-02-18 LAB — RAPID STREP SCREEN (MED CTR MEBANE ONLY): Streptococcus, Group A Screen (Direct): NEGATIVE

## 2018-02-18 MED ORDER — LIDOCAINE VISCOUS HCL 2 % MT SOLN: OROMUCOSAL | 0 refills | Status: DC

## 2018-02-18 NOTE — ED Triage Notes (Signed)
Pt here for sore throat since yesterday. No fever reported. Ibuprofen taken. No other symptoms

## 2018-02-18 NOTE — ED Provider Notes (Signed)
[facility]    CSN: [id number] Arrival date & time: 02/18/18  1608     History   Chief Complaint Chief Complaint  Patient presents with  . Sore Throat    HPI Ryan Sawyer is a 38 y.o. male.   The history is provided by the patient.  Sore Throat   URI  Presenting symptoms: cough and sore throat   Severity:  Moderate Onset quality:  Sudden Duration:  1 day Timing:  Constant Progression:  Unchanged Chronicity:  New Relieved by:  None tried Ineffective treatments:  None tried Associated symptoms: no wheezing   Risk factors: sick contacts     Past Medical History:  Diagnosis Date  . Club foot of both lower extremities   . Detached retina    right eye  . GERD (gastroesophageal reflux disease)     Patient Active Problem List   Diagnosis Date Noted  . Ganglion cyst 12/24/2017  . Muscle twitch 12/24/2017    Past Surgical History:  Procedure Laterality Date  . EYE SURGERY    . FOOT SURGERY         Home Medications    Prior to Admission medications   Medication Sig Start Date End Date Taking? Authorizing Provider  lidocaine (XYLOCAINE) 2 % solution 20 ml gargle and spit q 6 hours prn 02/18/18   Payton Mccallumonty, Arieliz Latino, MD  meclizine (ANTIVERT) 25 MG tablet Take 1 tablet (25 mg total) by mouth 3 (three) times daily as needed for dizziness. 08/15/17   Tommie Samsook, Jayce G, DO  omeprazole (PRILOSEC) 20 MG capsule Take 1 capsule (20 mg total) by mouth daily. 04/14/17   Tommie Samsook, Jayce G, DO    Family History Family History  Problem Relation Age of Onset  . Diabetes Mother   . Hyperlipidemia Mother   . Hypertension Mother   . Healthy Father     Social History Social History   Tobacco Use  . Smoking status: Never Smoker  . Smokeless tobacco: Never Used  Substance Use Topics  . Alcohol use: No  . Drug use: No     Allergies   Patient has no known allergies.   Review of Systems Review of Systems  HENT: Positive for sore throat.   Respiratory: Positive  for cough. Negative for wheezing.      Physical Exam Triage Vital Signs ED Triage Vitals  Enc Vitals Group     BP 02/18/18 1713 (!) 167/101     Pulse Rate 02/18/18 1713 89     Resp 02/18/18 1713 18     Temp 02/18/18 1713 98.3 F (36.8 C)     Temp Source 02/18/18 1713 Oral     SpO2 02/18/18 1713 100 %     Weight 02/18/18 1715 215 lb (97.5 kg)     Height 02/18/18 1715 5\' 9"  (1.753 m)     Head Circumference --      Peak Flow --      Pain Score 02/18/18 1714 8     Pain Loc --      Pain Edu? --      Excl. in GC? --    No data found.  Updated Vital Signs BP (!) 167/101 (BP Location: Right Arm)   Pulse 89   Temp 98.3 F (36.8 C) (Oral)   Resp 18   Ht 5\' 9"  (1.753 m)   Wt 97.5 kg   SpO2 100%   BMI 31.75 kg/m   Visual Acuity Right Eye Distance:   Left  Eye Distance:   Bilateral Distance:    Right Eye Near:   Left Eye Near:    Bilateral Near:     Physical Exam Vitals signs and nursing note reviewed.  Constitutional:      General: He is not in acute distress.    Appearance: He is well-developed. He is not toxic-appearing or diaphoretic.  HENT:     Mouth/Throat:     Pharynx: Posterior oropharyngeal erythema present. No oropharyngeal exudate.  Neck:     Musculoskeletal: Normal range of motion and neck supple.  Cardiovascular:     Rate and Rhythm: Normal rate and regular rhythm.  Pulmonary:     Effort: Pulmonary effort is normal. No respiratory distress.     Breath sounds: Normal breath sounds. No stridor. No wheezing, rhonchi or rales.  Neurological:     Mental Status: He is alert.      UC Treatments / Results  Labs (all labs ordered are listed, but only abnormal results are displayed) Labs Reviewed  RAPID STREP SCREEN ([facility] ONLY)  CULTURE, GROUP A STREP [facility])    EKG None  Radiology No results found.  Procedures Procedures (including critical care time)  Medications Ordered in UC Medications - No data to display  Initial Impression  / Assessment and Plan / UC Course  I have reviewed the triage vital signs and the nursing notes.  Pertinent labs & imaging results that were available during my care of the patient were reviewed by me and considered in my medical decision making (see chart for details).      Final Clinical Impressions(s) / UC Diagnoses   Final diagnoses:  Viral pharyngitis    ED Prescriptions    Medication Sig Dispense Auth. Provider   lidocaine (XYLOCAINE) 2 % solution 20 ml gargle and spit q 6 hours prn 100 mL Payton Mccallum, MD      1. Lab results and diagnosis reviewed with patient 2. rx as per orders above; reviewed possible side effects, interactions, risks and benefits  3. Recommend supportive treatment with otc analgesics 4. Follow-up prn if symptoms worsen or don't improve  Controlled Substance Prescriptions [place] Controlled Substance Registry consulted? Not Applicable   Payton Mccallum, MD 02/18/18 1755

## 2018-02-21 LAB — CULTURE, GROUP A STREP (THRC)

## 2018-03-04 ENCOUNTER — Other Ambulatory Visit: Payer: Self-pay

## 2018-03-04 ENCOUNTER — Encounter: Payer: Self-pay | Admitting: Emergency Medicine

## 2018-03-04 ENCOUNTER — Ambulatory Visit: Payer: Self-pay

## 2018-03-04 ENCOUNTER — Ambulatory Visit
Admission: EM | Admit: 2018-03-04 | Discharge: 2018-03-04 | Disposition: A | Discharge status: Home / Self Care | Payer: BLUE CROSS/BLUE SHIELD | Attending: Family Medicine | Admitting: Family Medicine

## 2018-03-04 DIAGNOSIS — R42 Dizziness and giddiness: Secondary | ICD-10-CM | POA: Diagnosis not present

## 2018-03-04 DIAGNOSIS — R51 Headache: Secondary | ICD-10-CM | POA: Insufficient documentation

## 2018-03-04 DIAGNOSIS — R269 Unspecified abnormalities of gait and mobility: Secondary | ICD-10-CM | POA: Diagnosis not present

## 2018-03-04 DIAGNOSIS — R519 Headache, unspecified: Secondary | ICD-10-CM

## 2018-03-04 HISTORY — DX: Dizziness and giddiness: R42

## 2018-03-04 MED ORDER — PREDNISONE 10 MG (21) PO TBPK: ORAL_TABLET | ORAL | 0 refills | Status: DC

## 2018-03-04 NOTE — ED Provider Notes (Signed)
[facility]    CSN: [id number] Arrival date & time: 03/04/18  1116     History   Chief Complaint Chief Complaint  Patient presents with  . Dizziness  . Headache  . Blurred Vision    HPI Ryan Sawyer is a 38 y.o. male.   38 year old male accompanied by his significant other with concern over dizziness and nausea that started about 1 week ago. Was intermittent at first but in the past 3 days has gotten worse with headache, nausea and increased dizziness. Today experienced blurred vision and lost balance. Caught himself at work and did not hit his head or suffer any injury. No LOC. No vomiting. Has had similar symptoms in the past. Had one episode in college, then did not have another episode until 3 years ago. Last episode was in July 2019 and was seen here. Concerned that episodes are occurring more frequently. Denies any fever. Was seen here about 2 weeks ago for URI symptoms that are resolving. 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 Also having unsteady gait due to dizziness. Has taken Meclizine with mixed results. Other chronic health issues include GERD and takes Prilosec prn.   The history is provided by the patient and a significant other.    Past Medical History:  Diagnosis Date  . Club foot of both lower extremities   . Detached retina    right eye  . GERD (gastroesophageal reflux disease)   . Vertigo     Patient Active Problem List   Diagnosis Date Noted  . Ganglion cyst 12/24/2017  . Muscle twitch 12/24/2017    Past Surgical History:  Procedure Laterality Date  . EYE SURGERY    . FOOT SURGERY         Home Medications    Prior to Admission medications   Medication Sig Start Date End Date Taking? Authorizing Provider  meclizine (ANTIVERT) 25 MG tablet Take 1 tablet (25 mg total) by mouth 3 (three) times daily as needed for dizziness. 08/15/17  Yes Cook, Jayce G, DO  omeprazole  (PRILOSEC) 20 MG capsule Take 1 capsule (20 mg total) by mouth daily. 04/14/17  Yes Cook, Jayce G, DO  predniSONE (STERAPRED UNI-PAK 21 TAB) 10 MG (21) TBPK tablet Take by mouth daily. Start with 6 tabs today then decrease by 1 tablet each day until finished. 03/04/18   Sudie GrumblingAmyot, Jacqualynn Parco Berry, NP    Family History Family History  Problem Relation Age of Onset  . Diabetes Mother   . Hyperlipidemia Mother   . Hypertension Mother   . Healthy Father     Social History Social History   Tobacco Use  . Smoking status: Never Smoker  . Smokeless tobacco: Never Used  Substance Use Topics  . Alcohol use: No  . Drug use: No     Allergies   Patient has no known allergies.   Review of Systems Review of Systems  Constitutional: Positive for appetite change. Negative for activity change, chills, diaphoresis, fatigue and fever.  HENT: Positive for postnasal drip. Negative for congestion, ear discharge, ear pain, facial swelling, hearing loss, mouth sores, nosebleeds, rhinorrhea, sinus pressure, sinus pain, sneezing, sore throat, tinnitus and trouble swallowing.   Eyes: Positive for visual disturbance. Negative for pain, discharge, redness and itching.  Respiratory: Negative for cough, chest tightness, shortness of breath and wheezing.   Cardiovascular: Negative for chest pain and palpitations.  Gastrointestinal: Positive for  nausea. Negative for diarrhea and vomiting.  Musculoskeletal: Positive for gait problem. Negative for arthralgias, myalgias, neck pain and neck stiffness.  Skin: Negative for color change, rash and wound.  Allergic/Immunologic: Negative for environmental allergies and food allergies.  Neurological: Positive for dizziness, light-headedness and headaches. Negative for tremors, seizures, syncope, facial asymmetry, speech difficulty, weakness and numbness.  Hematological: Negative for adenopathy. Does not bruise/bleed easily.     Physical Exam Triage Vital Signs ED Triage Vitals   Enc Vitals Group     BP 03/04/18 1151 130/87     Pulse Rate 03/04/18 1151 66     Resp 03/04/18 1151 18     Temp 03/04/18 1151 98.2 F (36.8 C)     Temp Source 03/04/18 1151 Oral     SpO2 03/04/18 1151 100 %     Weight 03/04/18 1148 215 lb (97.5 kg)     Height 03/04/18 1148 5\' 9"  (1.753 m)     Head Circumference --      Peak Flow --      Pain Score 03/04/18 1147 0     Pain Loc --      Pain Edu? --      Excl. in GC? --    No data found.  Updated Vital Signs BP 130/87 (BP Location: Left Arm)   Pulse 66   Temp 98.2 F (36.8 C) (Oral)   Resp 18   Ht 5\' 9"  (1.753 m)   Wt 215 lb (97.5 kg)   SpO2 100%   BMI 31.75 kg/m   Visual Acuity Right Eye Distance: 20/200(corrected) Left Eye Distance: 20/30(corrected) Bilateral Distance: 20/25(corrected)  Right Eye Near:   Left Eye Near:    Bilateral Near:     Physical Exam Vitals signs and nursing note reviewed.  Constitutional:      General: He is awake. He is not in acute distress.    Appearance: Normal appearance. He is well-developed and well-groomed. He is not ill-appearing.     Comments: Patient sitting comfortably on exam table in no acute distress.   HENT:     Head: Normocephalic and atraumatic.     Right Ear: Hearing, tympanic membrane, ear canal and external ear normal.     Left Ear: Hearing, tympanic membrane, ear canal and external ear normal.     Nose: Nose normal.     Mouth/Throat:     Lips: Pink.     Mouth: Mucous membranes are moist.     Pharynx: Oropharynx is clear. Uvula midline.  Eyes:     General: Lids are normal. Gaze aligned appropriately. Visual field deficit present.     Extraocular Movements: Extraocular movements intact.     Conjunctiva/sclera: Conjunctivae normal.     Pupils: Pupils are equal, round, and reactive to light.     Comments: EOM movements in all directions elicit dizziness.   Neck:     Musculoskeletal: Normal range of motion and neck supple. No neck rigidity, crepitus or muscular  tenderness.  Cardiovascular:     Rate and Rhythm: Normal rate and regular rhythm.     Heart sounds: Normal heart sounds. No murmur.  Pulmonary:     Effort: Pulmonary effort is normal. No respiratory distress.     Breath sounds: Normal breath sounds. No decreased breath sounds, wheezing, rhonchi or rales.  Musculoskeletal: Normal range of motion.  Lymphadenopathy:     Cervical: No cervical adenopathy.  Skin:    General: Skin is warm and dry.     Capillary  Refill: Capillary refill takes less than 2 seconds.     Findings: No rash.  Neurological:     Mental Status: He is alert and oriented to person, place, and time.     Cranial Nerves: No facial asymmetry.     Sensory: Sensation is intact. No sensory deficit.     Motor: Motor function is intact.     Coordination: Romberg sign positive. Coordination abnormal.     Gait: Tandem walk abnormal.  Psychiatric:        Attention and Perception: Attention normal.        Mood and Affect: Mood normal.        Speech: Speech normal.        Behavior: Behavior normal. Behavior is cooperative.      UC Treatments / Results  Labs (all labs ordered are listed, but only abnormal results are displayed) Labs Reviewed - No data to display  EKG None  Radiology No results found.  Procedures Procedures (including critical care time)  Medications Ordered in UC Medications - No data to display  Initial Impression / Assessment and Plan / UC Course  I have reviewed the triage vital signs and the nursing notes.  Pertinent labs & imaging results that were available during my care of the patient were reviewed by me and considered in my medical decision making (see chart for details).    Discussed with patient that he appears to have vertigo which is affecting his gait and balance. Uncertain of cause of headache. Discussed that he needs further evaluation of symptoms by an ENT. Discussed trial of steroids to see if inflammation is contributing towards  symptoms. May take Prednisone 10mg  6 day dose pack as directed. May continue Meclizine every 6 to 8 hours as needed- could also try Benadryl as directed. Change positions slowly to minimize symptoms. Note written for work. Recommend call [facility] today to schedule appointment for further evaluation or go to the ER ASAP if symptoms worsen.  Final Clinical Impressions(s) / UC Diagnoses   Final diagnoses:  Vertigo  Abnormality of gait  Acute intractable headache, unspecified headache type     Discharge Instructions     Recommend trial Prednisone 10mg  tablets- take 6 tablets today then decrease by 1 tablet each day until finished on day 6. May continue Meclizine every 6 to 8 hours as needed. Change positions slowly to minimize symptoms. Recommend call [facility] (in [facility] or [place]- whomever can see you first) today to schedule appointment for further evaluation. If symptoms worsen, go to the ER ASAP. Otherwise follow-up with the ENT as planned.     ED Prescriptions    Medication Sig Dispense Auth. Provider   predniSONE (STERAPRED UNI-PAK 21 TAB) 10 MG (21) TBPK tablet Take by mouth daily. Start with 6 tabs today then decrease by 1 tablet each day until finished. 21 tablet Sudie Grumbling, NP     Controlled Substance Prescriptions [place] Controlled Substance Registry consulted? Not Applicable   Sudie Grumbling, NP 03/04/18 2150

## 2018-03-04 NOTE — ED Triage Notes (Signed)
Pt c/o dizziness, headache, nausea, and blurred vision. He states that moving his head fast makes the dizziness worse. Started about a week ago intermittently but this time it started 3 days ago. Pt states that he has had vertigo in the past and feels similar.

## 2018-03-04 NOTE — Telephone Encounter (Signed)
Pt called to say he has had dizziness and headache and blurred vision since getting out of bed today. He is currently at work and the nurse there told him his BP is normal. He gives a Hx of URI treatment last week via urgent care. He denies symptoms today. He has no numbness or tingling anywhere. He states he is very unsteady on his feet.  He denies recent nausea or vomiting. Per protocol pt will go to the Ed for evaluation. Care advice read to patient. Pt states understanding of all instructions.  Reason for Disposition . Headache  (and neurologic deficit)  Answer Assessment - Initial Assessment Questions 1. SYMPTOM: "What is the main symptom you are concerned about?" (e.g., weakness, numbness)     Dizziness and blurred vision 2. ONSET: "When did this start?" (minutes, hours, days; while sleeping)     Last night 3. LAST NORMAL: "When was the last time you were normal (no symptoms)?"     Last night when going to bed 4. PATTERN "Does this come and go, or has it been constant since it started?"  "Is it present now?"     Intermittent prsent now 5. CARDIAC SYMPTOMS: "Have you had any of the following symptoms: chest pain, difficulty breathing, palpitations?"    no 6. NEUROLOGIC SYMPTOMS: "Have you had any of the following symptoms: headache, dizziness, vision loss, double vision, changes in speech, unsteady on your feet?"     Vertigo, headache, blurry vision,unsteady on feet  7. OTHER SYMPTOMS: "Do you have any other symptoms?"    Sick last week URI 8. PREGNANCY: "Is there any chance you are pregnant?" "When was your last menstrual period?"    No  Protocols used: NEUROLOGIC DEFICIT-A-AH

## 2018-03-04 NOTE — Discharge Instructions (Addendum)
Recommend trial Prednisone 10mg  tablets- take 6 tablets today then decrease by 1 tablet each day until finished on day 6. May continue Meclizine every 6 to 8 hours as needed. Change positions slowly to minimize symptoms. Recommend call [facility] (in [facility] or [place]- whomever can see you first) today to schedule appointment for further evaluation. If symptoms worsen, go to the ER ASAP. Otherwise follow-up with the ENT as planned.

## 2018-09-09 ENCOUNTER — Ambulatory Visit: Admit: 2018-09-09 | Payer: BLUE CROSS/BLUE SHIELD | Admitting: Specialist

## 2018-09-09 SURGERY — EXCISION, GANGLION CYST, WRIST
Anesthesia: General | Site: Wrist | Laterality: Right

## 2018-09-27 ENCOUNTER — Other Ambulatory Visit: Payer: Self-pay | Admitting: Orthopedic Surgery

## 2018-09-27 ENCOUNTER — Other Ambulatory Visit: Payer: Self-pay

## 2018-09-27 ENCOUNTER — Other Ambulatory Visit
Admission: RE | Admit: 2018-09-27 | Discharge: 2018-09-27 | Disposition: A | Discharge status: Home / Self Care | Payer: BC Managed Care – PPO | Source: Ambulatory Visit | Attending: Orthopedic Surgery | Admitting: Orthopedic Surgery

## 2018-09-27 DIAGNOSIS — Z01812 Encounter for preprocedural laboratory examination: Secondary | ICD-10-CM | POA: Insufficient documentation

## 2018-09-27 DIAGNOSIS — Z20828 Contact with and (suspected) exposure to other viral communicable diseases: Secondary | ICD-10-CM | POA: Diagnosis not present

## 2018-09-27 LAB — SARS CORONAVIRUS 2 (TAT 6-24 HRS): SARS Coronavirus 2: NEGATIVE

## 2018-10-01 ENCOUNTER — Observation Stay
Admission: RE | Admit: 2018-10-01 | Discharge: 2018-10-03 | Disposition: A | Discharge status: Home / Self Care | Payer: BC Managed Care – PPO | Attending: Orthopedic Surgery | Admitting: Orthopedic Surgery

## 2018-10-01 ENCOUNTER — Other Ambulatory Visit: Payer: Self-pay

## 2018-10-01 ENCOUNTER — Ambulatory Visit: Payer: BC Managed Care – PPO | Admitting: Anesthesiology

## 2018-10-01 ENCOUNTER — Encounter
Admission: RE | Disposition: A | Discharge status: Home / Self Care | Payer: Self-pay | Source: Home / Self Care | Attending: Orthopedic Surgery

## 2018-10-01 DIAGNOSIS — S86812A Strain of other muscle(s) and tendon(s) at lower leg level, left leg, initial encounter: Secondary | ICD-10-CM | POA: Diagnosis present

## 2018-10-01 DIAGNOSIS — S76112A Strain of left quadriceps muscle, fascia and tendon, initial encounter: Principal | ICD-10-CM | POA: Insufficient documentation

## 2018-10-01 DIAGNOSIS — R509 Fever, unspecified: Secondary | ICD-10-CM

## 2018-10-01 DIAGNOSIS — Y9367 Activity, basketball: Secondary | ICD-10-CM | POA: Diagnosis not present

## 2018-10-01 DIAGNOSIS — I973 Postprocedural hypertension: Secondary | ICD-10-CM | POA: Insufficient documentation

## 2018-10-01 DIAGNOSIS — K219 Gastro-esophageal reflux disease without esophagitis: Secondary | ICD-10-CM | POA: Diagnosis not present

## 2018-10-01 DIAGNOSIS — I97191 Other postprocedural cardiac functional disturbances following other surgery: Secondary | ICD-10-CM | POA: Diagnosis not present

## 2018-10-01 HISTORY — PX: PATELLAR TENDON REPAIR: SHX737

## 2018-10-01 LAB — BASIC METABOLIC PANEL
Anion gap: 10 (ref 5–15)
BUN: 19 mg/dL (ref 6–20)
CO2: 22 mmol/L (ref 22–32)
Calcium: 9.2 mg/dL (ref 8.9–10.3)
Chloride: 108 mmol/L (ref 98–111)
Creatinine, Ser: 1.3 mg/dL — ABNORMAL HIGH (ref 0.61–1.24)
GFR calc Af Amer: >60 mL/min (ref >60)
GFR calc non Af Amer: >60 mL/min (ref >60)
Glucose, Bld: 101 mg/dL — ABNORMAL HIGH (ref 70–99)
Potassium: 3.8 mmol/L (ref 3.5–5.1)
Sodium: 140 mmol/L (ref 135–145)

## 2018-10-01 LAB — PROTIME-INR
INR: 0.9 (ref 0.8–1.2)
Prothrombin Time: 12 seconds (ref 11.4–15.2)

## 2018-10-01 LAB — CBC WITH DIFFERENTIAL/PLATELET
Abs Immature Granulocytes: 0.02 10*3/uL (ref 0.00–0.07)
Basophils Absolute: 0 10*3/uL (ref 0.0–0.1)
Basophils Relative: 1 %
Eosinophils Absolute: 0.1 10*3/uL (ref 0.0–0.5)
Eosinophils Relative: 2 %
HCT: 42.4 % (ref 39.0–52.0)
Hemoglobin: 13.6 g/dL (ref 13.0–17.0)
Immature Granulocytes: 0 %
Lymphocytes Relative: 39 %
Lymphs Abs: 2.3 10*3/uL (ref 0.7–4.0)
MCH: 26.9 pg (ref 26.0–34.0)
MCHC: 32.1 g/dL (ref 30.0–36.0)
MCV: 83.8 fL (ref 80.0–100.0)
Monocytes Absolute: 0.6 10*3/uL (ref 0.1–1.0)
Monocytes Relative: 10 %
Neutro Abs: 2.8 10*3/uL (ref 1.7–7.7)
Neutrophils Relative %: 48 %
Platelets: 349 10*3/uL (ref 150–400)
RBC: 5.06 MIL/uL (ref 4.22–5.81)
RDW: 12.5 % (ref 11.5–15.5)
WBC: 5.8 10*3/uL (ref 4.0–10.5)
nRBC: 0 % (ref 0.0–0.2)

## 2018-10-01 LAB — APTT: aPTT: 35 seconds (ref 24–36)

## 2018-10-01 SURGERY — REPAIR, TENDON, PATELLAR
Anesthesia: General | Site: Knee | Laterality: Left

## 2018-10-01 MED ORDER — ACETAMINOPHEN 500 MG PO TABS
1000.0000 mg | ORAL_TABLET | Freq: Four times a day (QID) | ORAL | Status: AC
  Administered 2018-10-01 – 2018-10-02 (×3): 1000 mg via ORAL
  Filled 2018-10-01 (×3): qty 2

## 2018-10-01 MED ORDER — SUGAMMADEX SODIUM 500 MG/5ML IV SOLN
INTRAVENOUS | Status: DC | PRN
  Administered 2018-10-01: 199.4 mg via INTRAVENOUS

## 2018-10-01 MED ORDER — METHOCARBAMOL 1000 MG/10ML IJ SOLN
500.0000 mg | Freq: Four times a day (QID) | INTRAVENOUS | Status: DC | PRN
  Filled 2018-10-01: qty 5

## 2018-10-01 MED ORDER — OXYCODONE HCL 5 MG PO TABS
10.0000 mg | ORAL_TABLET | ORAL | Status: DC | PRN
  Administered 2018-10-01 – 2018-10-03 (×5): 10 mg via ORAL
  Filled 2018-10-01 (×5): qty 2

## 2018-10-01 MED ORDER — LIDOCAINE HCL (CARDIAC) PF 100 MG/5ML IV SOSY
PREFILLED_SYRINGE | INTRAVENOUS | Status: DC | PRN
  Administered 2018-10-01: 60 mg via INTRAVENOUS

## 2018-10-01 MED ORDER — FENTANYL CITRATE (PF) 100 MCG/2ML IJ SOLN
INTRAMUSCULAR | Status: AC
  Filled 2018-10-01: qty 2

## 2018-10-01 MED ORDER — CHLORHEXIDINE GLUCONATE CLOTH 2 % EX PADS: 6.0000 | MEDICATED_PAD | Freq: Once | CUTANEOUS | Status: DC

## 2018-10-01 MED ORDER — ONDANSETRON HCL 4 MG PO TABS: 4.0000 mg | ORAL_TABLET | Freq: Three times a day (TID) | ORAL | 0 refills | Status: DC | PRN

## 2018-10-01 MED ORDER — ALUM & MAG HYDROXIDE-SIMETH 200-200-20 MG/5ML PO SUSP: 30.0000 mL | ORAL | Status: DC | PRN

## 2018-10-01 MED ORDER — ACETAMINOPHEN 325 MG PO TABS
325.0000 mg | ORAL_TABLET | Freq: Four times a day (QID) | ORAL | Status: DC | PRN
  Administered 2018-10-02: 325 mg via ORAL
  Filled 2018-10-01: qty 1

## 2018-10-01 MED ORDER — CEFAZOLIN SODIUM-DEXTROSE 2-4 GM/100ML-% IV SOLN
2.0000 g | INTRAVENOUS | Status: AC
  Administered 2018-10-01 (×2): 2 g via INTRAVENOUS

## 2018-10-01 MED ORDER — ASPIRIN EC 325 MG PO TBEC: 325.0000 mg | DELAYED_RELEASE_TABLET | Freq: Two times a day (BID) | ORAL | 0 refills | Status: DC

## 2018-10-01 MED ORDER — ONDANSETRON HCL 4 MG/2ML IJ SOLN: 4.0000 mg | Freq: Once | INTRAMUSCULAR | Status: DC | PRN

## 2018-10-01 MED ORDER — DIPHENHYDRAMINE HCL 25 MG PO CAPS: 25.0000 mg | ORAL_CAPSULE | Freq: Four times a day (QID) | ORAL | Status: DC | PRN

## 2018-10-01 MED ORDER — MIDAZOLAM HCL 2 MG/2ML IJ SOLN
INTRAMUSCULAR | Status: DC | PRN
  Administered 2018-10-01: 2 mg via INTRAVENOUS

## 2018-10-01 MED ORDER — ENOXAPARIN SODIUM 40 MG/0.4ML ~~LOC~~ SOLN
40.0000 mg | SUBCUTANEOUS | Status: DC
  Administered 2018-10-02 – 2018-10-03 (×2): 40 mg via SUBCUTANEOUS
  Filled 2018-10-01 (×2): qty 0.4

## 2018-10-01 MED ORDER — BISACODYL 10 MG RE SUPP: 10.0000 mg | Freq: Every day | RECTAL | Status: DC | PRN

## 2018-10-01 MED ORDER — ONDANSETRON HCL 4 MG PO TABS: 4.0000 mg | ORAL_TABLET | Freq: Four times a day (QID) | ORAL | Status: DC | PRN

## 2018-10-01 MED ORDER — LABETALOL HCL 5 MG/ML IV SOLN
INTRAVENOUS | Status: AC
  Administered 2018-10-01: 12:00:00 10 mg via INTRAVENOUS
  Filled 2018-10-01: qty 4

## 2018-10-01 MED ORDER — ONDANSETRON HCL 4 MG/2ML IJ SOLN: 4.0000 mg | Freq: Four times a day (QID) | INTRAMUSCULAR | Status: DC | PRN

## 2018-10-01 MED ORDER — PROPOFOL 10 MG/ML IV BOLUS
INTRAVENOUS | Status: DC | PRN
  Administered 2018-10-01: 180 mg via INTRAVENOUS

## 2018-10-01 MED ORDER — BUPIVACAINE HCL (PF) 0.25 % IJ SOLN
INTRAMUSCULAR | Status: AC
  Filled 2018-10-01: qty 30

## 2018-10-01 MED ORDER — HYDROMORPHONE HCL 1 MG/ML IJ SOLN
0.2500 mg | INTRAMUSCULAR | Status: DC | PRN
  Administered 2018-10-01 (×8): 0.25 mg via INTRAVENOUS

## 2018-10-01 MED ORDER — FENTANYL CITRATE (PF) 100 MCG/2ML IJ SOLN
25.0000 ug | INTRAMUSCULAR | Status: AC | PRN
  Administered 2018-10-01 (×6): 25 ug via INTRAVENOUS

## 2018-10-01 MED ORDER — LACTATED RINGERS IV SOLN
INTRAVENOUS | Status: DC
  Administered 2018-10-01 (×2): via INTRAVENOUS

## 2018-10-01 MED ORDER — FAMOTIDINE 20 MG PO TABS
ORAL_TABLET | ORAL | Status: AC
  Administered 2018-10-01: 07:00:00 20 mg
  Filled 2018-10-01: qty 1

## 2018-10-01 MED ORDER — POLYETHYLENE GLYCOL 3350 17 G PO PACK: 17.0000 g | PACK | Freq: Every day | ORAL | Status: DC | PRN

## 2018-10-01 MED ORDER — ACETAMINOPHEN 10 MG/ML IV SOLN
INTRAVENOUS | Status: AC
  Filled 2018-10-01: qty 100

## 2018-10-01 MED ORDER — MENTHOL 3 MG MT LOZG
1.0000 | LOZENGE | OROMUCOSAL | Status: DC | PRN
  Filled 2018-10-01: qty 9

## 2018-10-01 MED ORDER — OXYCODONE HCL 5 MG PO TABS: 5.0000 mg | ORAL_TABLET | ORAL | Status: DC | PRN

## 2018-10-01 MED ORDER — NEOMYCIN-POLYMYXIN B GU 40-200000 IR SOLN
Status: DC | PRN
  Administered 2018-10-01: 4 mL

## 2018-10-01 MED ORDER — HYDROMORPHONE HCL 1 MG/ML IJ SOLN: 0.2500 mg | INTRAMUSCULAR | Status: DC | PRN

## 2018-10-01 MED ORDER — TRAMADOL HCL 50 MG PO TABS
50.0000 mg | ORAL_TABLET | Freq: Four times a day (QID) | ORAL | Status: DC
  Administered 2018-10-01 – 2018-10-03 (×8): 50 mg via ORAL
  Filled 2018-10-01 (×8): qty 1

## 2018-10-01 MED ORDER — METHOCARBAMOL 500 MG PO TABS
500.0000 mg | ORAL_TABLET | Freq: Four times a day (QID) | ORAL | Status: DC | PRN
  Administered 2018-10-01 – 2018-10-02 (×3): 500 mg via ORAL
  Filled 2018-10-01 (×3): qty 1

## 2018-10-01 MED ORDER — PHENOL 1.4 % MT LIQD
1.0000 | OROMUCOSAL | Status: DC | PRN
  Filled 2018-10-01: qty 177

## 2018-10-01 MED ORDER — EPHEDRINE SULFATE 50 MG/ML IJ SOLN
INTRAMUSCULAR | Status: AC
  Filled 2018-10-01: qty 1

## 2018-10-01 MED ORDER — ACETAMINOPHEN 10 MG/ML IV SOLN
INTRAVENOUS | Status: DC | PRN
  Administered 2018-10-01: 1000 mg via INTRAVENOUS

## 2018-10-01 MED ORDER — CEFAZOLIN SODIUM-DEXTROSE 2-4 GM/100ML-% IV SOLN
INTRAVENOUS | Status: AC
  Filled 2018-10-01: qty 100

## 2018-10-01 MED ORDER — HYDROMORPHONE HCL 1 MG/ML IJ SOLN
0.5000 mg | INTRAMUSCULAR | Status: DC | PRN
  Administered 2018-10-02: 0.5 mg via INTRAVENOUS
  Filled 2018-10-01: qty 1

## 2018-10-01 MED ORDER — HYDROMORPHONE HCL 1 MG/ML IJ SOLN
INTRAMUSCULAR | Status: AC
  Administered 2018-10-01: 0.25 mg via INTRAVENOUS
  Filled 2018-10-01: qty 1

## 2018-10-01 MED ORDER — DOCUSATE SODIUM 100 MG PO CAPS
100.0000 mg | ORAL_CAPSULE | Freq: Two times a day (BID) | ORAL | Status: DC
  Administered 2018-10-01 – 2018-10-03 (×4): 100 mg via ORAL
  Filled 2018-10-01 (×4): qty 1

## 2018-10-01 MED ORDER — POTASSIUM CHLORIDE IN NACL 20-0.9 MEQ/L-% IV SOLN
INTRAVENOUS | Status: DC
  Administered 2018-10-01: 15:00:00 via INTRAVENOUS
  Filled 2018-10-01 (×5): qty 1000

## 2018-10-01 MED ORDER — OXYCODONE HCL 5 MG PO TABS: 5.0000 mg | ORAL_TABLET | ORAL | 0 refills | Status: DC | PRN

## 2018-10-01 MED ORDER — HYDROMORPHONE HCL 1 MG/ML IJ SOLN
INTRAMUSCULAR | Status: AC
  Administered 2018-10-01: 13:00:00 0.25 mg via INTRAVENOUS
  Filled 2018-10-01: qty 1

## 2018-10-01 MED ORDER — FENTANYL CITRATE (PF) 100 MCG/2ML IJ SOLN
INTRAMUSCULAR | Status: AC
  Administered 2018-10-01: 12:00:00 25 ug via INTRAVENOUS
  Filled 2018-10-01: qty 2

## 2018-10-01 MED ORDER — CEFAZOLIN SODIUM-DEXTROSE 1-4 GM/50ML-% IV SOLN
1.0000 g | Freq: Four times a day (QID) | INTRAVENOUS | Status: AC
  Administered 2018-10-01 (×2): 1 g via INTRAVENOUS
  Filled 2018-10-01 (×2): qty 50

## 2018-10-01 MED ORDER — ONDANSETRON HCL 4 MG/2ML IJ SOLN
INTRAMUSCULAR | Status: DC | PRN
  Administered 2018-10-01: 4 mg via INTRAVENOUS

## 2018-10-01 MED ORDER — LIDOCAINE HCL (PF) 2 % IJ SOLN
INTRAMUSCULAR | Status: AC
  Filled 2018-10-01: qty 10

## 2018-10-01 MED ORDER — MIDAZOLAM HCL 2 MG/2ML IJ SOLN
INTRAMUSCULAR | Status: AC
  Filled 2018-10-01: qty 2

## 2018-10-01 MED ORDER — LABETALOL HCL 5 MG/ML IV SOLN
10.0000 mg | INTRAVENOUS | Status: AC | PRN
  Administered 2018-10-01 (×2): 10 mg via INTRAVENOUS
  Filled 2018-10-01 (×3): qty 4

## 2018-10-01 MED ORDER — MAGNESIUM CITRATE PO SOLN
1.0000 | Freq: Once | ORAL | Status: DC | PRN
  Filled 2018-10-01: qty 296

## 2018-10-01 MED ORDER — BUPIVACAINE HCL (PF) 0.25 % IJ SOLN
INTRAMUSCULAR | Status: DC | PRN
  Administered 2018-10-01: 30 mL

## 2018-10-01 MED ORDER — PROPOFOL 10 MG/ML IV BOLUS
INTRAVENOUS | Status: AC
  Filled 2018-10-01: qty 20

## 2018-10-01 MED ORDER — FERROUS SULFATE 325 (65 FE) MG PO TABS
325.0000 mg | ORAL_TABLET | Freq: Three times a day (TID) | ORAL | Status: DC
  Administered 2018-10-01 – 2018-10-03 (×5): 325 mg via ORAL
  Filled 2018-10-01 (×5): qty 1

## 2018-10-01 MED ORDER — ADULT MULTIVITAMIN W/MINERALS CH
1.0000 | ORAL_TABLET | Freq: Every day | ORAL | Status: DC
  Administered 2018-10-02 – 2018-10-03 (×2): 1 via ORAL
  Filled 2018-10-01 (×2): qty 1

## 2018-10-01 MED ORDER — HYDROMORPHONE HCL 1 MG/ML IJ SOLN
0.2500 mg | INTRAMUSCULAR | Status: AC | PRN
  Administered 2018-10-01 (×4): 0.25 mg via INTRAVENOUS

## 2018-10-01 MED ORDER — PANTOPRAZOLE SODIUM 40 MG PO TBEC
40.0000 mg | DELAYED_RELEASE_TABLET | Freq: Every day | ORAL | Status: DC
  Administered 2018-10-02 – 2018-10-03 (×2): 40 mg via ORAL
  Filled 2018-10-01 (×2): qty 1

## 2018-10-01 MED ORDER — FENTANYL CITRATE (PF) 100 MCG/2ML IJ SOLN
INTRAMUSCULAR | Status: AC
  Administered 2018-10-01: 11:00:00 25 ug via INTRAVENOUS
  Filled 2018-10-01: qty 2

## 2018-10-01 MED ORDER — FENTANYL CITRATE (PF) 100 MCG/2ML IJ SOLN
INTRAMUSCULAR | Status: DC | PRN
  Administered 2018-10-01 (×2): 25 ug via INTRAVENOUS
  Administered 2018-10-01 (×2): 50 ug via INTRAVENOUS
  Administered 2018-10-01: 25 ug via INTRAVENOUS
  Administered 2018-10-01 (×2): 50 ug via INTRAVENOUS

## 2018-10-01 MED ORDER — ROCURONIUM BROMIDE 100 MG/10ML IV SOLN
INTRAVENOUS | Status: DC | PRN
  Administered 2018-10-01: 50 mg via INTRAVENOUS

## 2018-10-01 SURGICAL SUPPLY — 64 items
ANCHOR SUT BIO SW 4.75X19.1 (Anchor) ×3 IMPLANT
BLADE SURG SZ10 CARB STEEL (BLADE) ×6 IMPLANT
BNDG ESMARK 6X12 TAN STRL LF (GAUZE/BANDAGES/DRESSINGS) ×3 IMPLANT
BUR SURG 4X8 MED (BURR) IMPLANT
BURR SURG 4MMX8MM MEDIUM (BURR)
BURR SURG 4X8 MED (BURR)
CANISTER SUCT 1200ML W/VALVE (MISCELLANEOUS) ×3 IMPLANT
COOLER POLAR GLACIER W/PUMP (MISCELLANEOUS) ×3 IMPLANT
COVER WAND RF STERILE (DRAPES) ×3 IMPLANT
CUFF TOURN SGL QUICK 24 (TOURNIQUET CUFF)
CUFF TOURN SGL QUICK 30 (TOURNIQUET CUFF) ×2
CUFF TRNQT CYL 24X4X16.5-23 (TOURNIQUET CUFF) IMPLANT
CUFF TRNQT CYL 30X4X21-28X (TOURNIQUET CUFF) ×1 IMPLANT
DRAPE U-SHAPE 47X51 STRL (DRAPES) ×3 IMPLANT
DRSG OPSITE POSTOP 4X12 (GAUZE/BANDAGES/DRESSINGS) ×6 IMPLANT
DURAPREP 26ML APPLICATOR (WOUND CARE) ×9 IMPLANT
ELECT REM PT RETURN 9FT ADLT (ELECTROSURGICAL) ×3
ELECTRODE REM PT RTRN 9FT ADLT (ELECTROSURGICAL) ×1 IMPLANT
FIBER TAPE 2MM (SUTURE) ×3 IMPLANT
FIBER TAPE ARTHREX 2MM ×3 IMPLANT
GAUZE SPONGE 4X4 12PLY STRL (GAUZE/BANDAGES/DRESSINGS) ×3 IMPLANT
GLOVE BIOGEL PI IND STRL 9 (GLOVE) ×1 IMPLANT
GLOVE BIOGEL PI INDICATOR 9 (GLOVE) ×2
GLOVE SURG 9.0 ORTHO LTXF (GLOVE) ×6 IMPLANT
GOWN STRL REUS TWL 2XL XL LVL4 (GOWN DISPOSABLE) ×3 IMPLANT
GOWN STRL REUS W/ TWL LRG LVL3 (GOWN DISPOSABLE) ×1 IMPLANT
GOWN STRL REUS W/TWL LRG LVL3 (GOWN DISPOSABLE) ×2
HANDLE YANKAUER SUCT BULB TIP (MISCELLANEOUS) ×3 IMPLANT
IMMBOLIZER KNEE 19 BLUE UNIV (SOFTGOODS) IMPLANT
KIT TURNOVER KIT A (KITS) ×3 IMPLANT
NDL MAYO CATGUT SZ5 (NEEDLE) ×2
NDL SAFETY ECLIPSE 18X1.5 (NEEDLE) ×1 IMPLANT
NDL SUT 5 .5 CRC TPR PNT MAYO (NEEDLE) ×1 IMPLANT
NEEDLE HYPO 18GX1.5 SHARP (NEEDLE) ×2
NEEDLE MAYO CATGUT SZ4 (NEEDLE) ×3 IMPLANT
PACK EXTREMITY ARMC (MISCELLANEOUS) ×3 IMPLANT
PAD ABD DERMACEA PRESS 5X9 (GAUZE/BANDAGES/DRESSINGS) ×3 IMPLANT
PAD WRAPON POLAR KNEE (MISCELLANEOUS) IMPLANT
PAD WRAPON POLOR MULTI XL (MISCELLANEOUS) ×1 IMPLANT
PADDING CAST 4IN STRL (MISCELLANEOUS) ×2
PADDING CAST BLEND 4X4 STRL (MISCELLANEOUS) ×1 IMPLANT
RETRIEVER SUT HEWSON (MISCELLANEOUS) IMPLANT
SPONGE LAP 18X18 RF (DISPOSABLE) ×6 IMPLANT
STAPLER SKIN PROX 35W (STAPLE) ×3 IMPLANT
STOCKINETTE BIAS CUT 6 980064 (GAUZE/BANDAGES/DRESSINGS) ×3 IMPLANT
STOCKINETTE M/LG 89821 (MISCELLANEOUS) ×3 IMPLANT
SUT ETHIBOND 5-0 MS/4 CCS GRN (SUTURE)
SUT FIBERWIRE #5 38 CONV BLUE (SUTURE) ×12
SUT ORTHOCORD OS-6 NDL 36 (SUTURE) IMPLANT
SUT ORTHOCORD W/MULTIPK NDL (SUTURE) IMPLANT
SUT TICRON 2-0 30IN 311381 (SUTURE) ×9 IMPLANT
SUT VIC AB 0 CT1 36 (SUTURE) ×6 IMPLANT
SUT VIC AB 2-0 CT1 (SUTURE) ×6 IMPLANT
SUT VICRYL 1-0 27IN AB (SUTURE) ×1
SUT VICRYL 1-0 27IN ABS (SUTURE) ×2
SUTURE ETHBND 5-0 MS/4 CCS GRN (SUTURE) IMPLANT
SUTURE FIBERWR #5 38 CONV BLUE (SUTURE) ×4 IMPLANT
SUTURE VICRYL 1-0 27IN AB (SUTURE) ×1 IMPLANT
SYR 20ML LL LF (SYRINGE) ×3 IMPLANT
SYSTEM IMPL ACL/PCL SWIVILLOCK (Anchor) ×3 IMPLANT
TOWEL SURG RFD BLUE STRL DISP (DISPOSABLE) ×3 IMPLANT
WRAP-ON POLOR PAD MULTI XL (MISCELLANEOUS) ×1
WRAPON POLAR PAD KNEE (MISCELLANEOUS)
WRAPON POLOR PAD MULTI XL (MISCELLANEOUS) ×2

## 2018-10-01 NOTE — Anesthesia Preprocedure Evaluation (Signed)
Anesthesia Evaluation  Patient identified by MRN, date of birth, ID band Patient awake    Reviewed: Allergy & Precautions, NPO status , Patient's Chart, lab work & pertinent test results  Airway Mallampati: II  TM Distance: >3 FB     Dental   Pulmonary neg pulmonary ROS,    Pulmonary exam normal        Cardiovascular negative cardio ROS Normal cardiovascular exam     Neuro/Psych negative neurological ROS  negative psych ROS   GI/Hepatic Neg liver ROS, GERD  ,  Endo/Other  negative endocrine ROS  Renal/GU negative Renal ROS  negative genitourinary   Musculoskeletal negative musculoskeletal ROS (+)   Abdominal Normal abdominal exam  (+)   Peds negative pediatric ROS (+)  Hematology negative hematology ROS (+)   Anesthesia Other Findings Past Medical History: No date: Club foot of both lower extremities No date: Detached retina     Comment:  right eye No date: GERD (gastroesophageal reflux disease) No date: Vertigo  Reproductive/Obstetrics                             Anesthesia Physical Anesthesia Plan  ASA: II  Anesthesia Plan: General   Post-op Pain Management:    Induction: Intravenous  PONV Risk Score and Plan:   Airway Management Planned: Oral ETT  Additional Equipment:   Intra-op Plan:   Post-operative Plan: Extubation in OR  Informed Consent: I have reviewed the patients History and Physical, chart, labs and discussed the procedure including the risks, benefits and alternatives for the proposed anesthesia with the patient or authorized representative who has indicated his/her understanding and acceptance.     Dental advisory given  Plan Discussed with: CRNA and Surgeon  Anesthesia Plan Comments:         Anesthesia Quick Evaluation

## 2018-10-01 NOTE — Op Note (Signed)
10/01/2018  11:26 AM  PATIENT:  Ryan Sawyer    PRE-OPERATIVE DIAGNOSIS: Left patella tendon rupture  POST-OPERATIVE DIAGNOSIS:  Same  PROCEDURE:  OPEN LEFT PATELLA TENDON REPAIR  SURGEON:  Thornton Park, MD  ANESTHESIA:   General  PREOPERATIVE INDICATIONS:  Ryan Sawyer is a  38 y.o. male with a diagnosis of a left patella tendon rupture.  Patient had an MRI confirming a complete tear of the patella tendon.  Patient is unable to stand or actively extend his knee.  Surgery was recommended for patella tendon repair.  I discussed the risks and benefits of surgery. The risks include but are not limited to infection, bleeding requiring blood transfusion, nerve or blood vessel injury, joint stiffness or loss of motion, persistent pain, weakness or instability, re-tear of the patella tendon, failure of the repair and the need for further surgery. Medical risks include but are not limited to DVT and pulmonary embolism, myocardial infarction, stroke, pneumonia, respiratory failure and death. Patient understood these risks and wished to proceed.    OPERATIVE IMPLANTS: Arthrex 2 x 4.75 mm swivel lock anchors  OPERATIVE FINDINGS: Complete intrasubstance tear of the left patella tendon  OPERATIVE PROCEDURE: Patient was met in the preoperative area.  A preop history and physical was performed at the bedside this morning.  I marked the left lower extremity with my initials and the word yes according the hospital's correct site of surgery protocol.  I described the details of the operation as well as the postoperative course with the patient.  I also discussed the risks and benefits of the surgery.  He understands the risks include but not limited to infection, bleeding, nerve or blood vessel injury, knee stiffness, persistent pain or instability, persistent weakness, re-tear of the patella tendon, failure of the repair and the need for further surgery.  He also understands the medical risks include  DVT and pulmonary embolism, myocardial infarction, stroke, pneumonia, respiratory failure and death.  Patient understood these risks and wished to proceed.  Patient is brought to the operating room.  He is placed supine on the operative table.  A bump was placed under his left hip and a tourniquet applied to the left thigh.  Patient was prepped and draped in a sterile fashion.  A timeout was performed to verify the patient's name, date of birth, medical record number, correct site of surgery and correct procedure to be performed.  The timeout was also used to confirm the patient received antibiotics and all appropriate instruments, implants and radiographic studies were available in the room.  Once all in attendance were in agreement the case began.  The left lower extremity was exsanguinated with an Esmarch and the tourniquet inflated to 300 mmHg.  A midline incision was made over the left knee.  Full-thickness skin flaps were developed.  The patella tendon rupture was identified and confirmed to be mid substance.  The knee joint was copiously irrigated.  There is no focal chondral defects of the patella or trochlea seen.  The ends patella tendon were sharply debrided to freshen the edges back to healthy tendon tissue.  Two #5 FiberWire sutures were then placed in the proximal end of the patella tendon in a Krakw fashion.  Then two #5 FiberWire sutures were placed in the distal portion of the patella tendon tear also using a Krakw technique.  Before sutures were then tied together approximating the ends of the tendons together.  The tendon tear was then oversewn with interrupted #2Ticron.  Medial and lateral retinaculum were also repaired with interrupted #2 Tycron sutures.  Wound was then copiously irrigated.  An Arthrex fiber tape was then woven along the lateral side of the patella and then woven across through the distal end of the quadriceps tendon and down the medial side of the patella.  The ends of  the fiber tape were then crossed.  Two 4.75 mm swivel lock anchors were then placed in the proximal tibia near the tibial tubercle.  With the knee flexed 90 degrees the ends of the fiber tape were tensioned to protect the patella tendon repair and anchored into the proximal tibia with the swivel lock anchors.  The patient's knee was then ranged from 0 to 90 degrees without undue tension on the repair or gapping at the repair site.  The wound was again copiously irrigated.  Subcutaneous tissue was closed with 2.0 Vicryl and 0 Vicryl.  Skin was approximated staples.  The patient's left knee was injected along the incision with quarter percent Marcaine plain.  An intra-articular injection with quarter percent Marcaine plain was also performed.  A total of 30 cc of quarter percent Marcaine plain was administered.  A dry sterile dressing was applied along with a Polar Care sleeve and a hinged knee brace locked in extension.  The patient was awoken and brought to the PACU in stable condition.  I spoke with the patient's wife in the postop consultation room to let her know the case was performed without complication the patient was stable in the recovery room.

## 2018-10-01 NOTE — Anesthesia Post-op Follow-up Note (Signed)
Anesthesia QCDR form completed.        

## 2018-10-01 NOTE — Progress Notes (Signed)
Patient temp 101.1 MD paged. MD oder to give 1000mg  tylenol ordered to give at midnight to be given now

## 2018-10-01 NOTE — Discharge Instructions (Signed)

## 2018-10-01 NOTE — Anesthesia Procedure Notes (Signed)
Procedure Name: Intubation Date/Time: 10/01/2018 8:10 AM Performed by: Allean Found, CRNA Pre-anesthesia Checklist: Patient identified, Patient being monitored, Timeout performed, Emergency Drugs available and Suction available Patient Re-evaluated:Patient Re-evaluated prior to induction Oxygen Delivery Method: Circle system utilized Preoxygenation: Pre-oxygenation with 100% oxygen Induction Type: IV induction Ventilation: Mask ventilation without difficulty Laryngoscope Size: 3 and McGraph Grade View: Grade I Tube type: Oral Tube size: 7.5 mm Number of attempts: 1 Airway Equipment and Method: Stylet Placement Confirmation: ETT inserted through vocal cords under direct vision,  positive ETCO2 and breath sounds checked- equal and bilateral Secured at: 21 cm Tube secured with: Tape Dental Injury: Teeth and Oropharynx as per pre-operative assessment

## 2018-10-01 NOTE — Transfer of Care (Signed)
Immediate Anesthesia Transfer of Care Note  Patient: Ryan Sawyer  Procedure(s) Performed: PATELLA TENDON REPAIR (Left Knee)  Patient Location: PACU  Anesthesia Type:General  Level of Consciousness: sedated  Airway & Oxygen Therapy: Patient Spontanous Breathing and Patient connected to face mask oxygen  Post-op Assessment: Report given to RN and Post -op Vital signs reviewed and stable  Post vital signs: Reviewed and stable  Last Vitals:  Vitals Value Taken Time  BP 141/83 10/01/18 1042  Temp 36.3 C 10/01/18 1042  Pulse 97 10/01/18 1051  Resp 17 10/01/18 1051  SpO2 100 % 10/01/18 1051  Vitals shown include unvalidated device data.  Last Pain:  Vitals:   10/01/18 1042  TempSrc:   PainSc: Asleep         Complications: No apparent anesthesia complications

## 2018-10-01 NOTE — H&P (Signed)
PREOPERATIVE H&P  Chief Complaint: Left patella tendon rupture  HPI: Ryan Sawyer is a 38 y.o. male who presents for preoperative history and physical with a diagnosis of left patella tendon rupture.  Patient injured his left knee playing basketball with his nephews.  He had an acute onset of pain, inability to bear weight or extend his knee.  This significantly impairing activities of daily living.  He has been recommended for surgical repair.  Past Medical History:  Diagnosis Date  . Club foot of both lower extremities   . Detached retina    right eye  . GERD (gastroesophageal reflux disease)   . Vertigo    Past Surgical History:  Procedure Laterality Date  . EYE SURGERY    . FOOT SURGERY     Social History   Socioeconomic History  . Marital status: Married    Spouse name: Not on file  . Number of children: Not on file  . Years of education: Not on file  . Highest education level: Not on file  Occupational History  . Not on file  Social Needs  . Financial resource strain: Not on file  . Food insecurity    Worry: Not on file    Inability: Not on file  . Transportation needs    Medical: Not on file    Non-medical: Not on file  Tobacco Use  . Smoking status: Never Smoker  . Smokeless tobacco: Never Used  Substance and Sexual Activity  . Alcohol use: No  . Drug use: No  . Sexual activity: Not on file  Lifestyle  . Physical activity    Days per week: Not on file    Minutes per session: Not on file  . Stress: Not on file  Relationships  . Social Herbalist on phone: Not on file    Gets together: Not on file    Attends religious service: Not on file    Active member of club or organization: Not on file    Attends meetings of clubs or organizations: Not on file    Relationship status: Not on file  Other Topics Concern  . Not on file  Social History Narrative  . Not on file   Family History  Problem Relation Age of Onset  . Diabetes Mother   .  Hyperlipidemia Mother   . Hypertension Mother   . Healthy Father    No Known Allergies Prior to Admission medications   Medication Sig Start Date End Date Taking? Authorizing Provider  Multiple Vitamin (MULTIVITAMIN WITH MINERALS) TABS tablet Take 1 tablet by mouth daily.   Yes [provider]  omeprazole (PRILOSEC) 20 MG capsule Take 1 capsule (20 mg total) by mouth daily. 04/14/17  Yes Cook, Jayce G, DO     Positive ROS: All other systems have been reviewed and were otherwise negative with the exception of those mentioned in the HPI and as above.  Physical Exam: General: Alert, no acute distress Cardiovascular: Regular rate and rhythm, no murmurs rubs or gallops.  No pedal edema Respiratory: Clear to auscultation bilaterally, no wheezes rales or rhonchi. No cyanosis, no use of accessory musculature GI: No organomegaly, abdomen is soft and non-tender nondistended with positive bowel sounds. Skin: Skin intact, no lesions within the operative field. Neurologic: Sensation intact distally Psychiatric: Patient is competent for consent with normal mood and affect Lymphatic: No cervical lymphadenopathy  MUSCULOSKELETAL: Left knee: Patient skin is intact.  He has mild swelling but no erythema  or ecchymosis.  Is a palpable defect of the patella.  The patella is high riding.  Patient has palpable pedal pulses, intact sensation light touch and intact motor function distally.  He is unable to actively extend or straight leg raise.  Assessment: Left patella tendon rupture  Plan: Plan for Procedure(s): LEFT PATELLA TENDON REPAIR  I have reviewed the details of the operation as well as the postoperative course with the patient.  I discussed the risks and benefits of surgery. The risks include but are not limited to infection, bleeding requiring transfusion, nerve or blood vessel injury, joint stiffness or loss of motion, persistent pain, weakness or instability, re-tear of the patella  tendon, failure of the repair and hardware failure and the need for further surgery. Medical risks include but are not limited to DVT and pulmonary embolism, myocardial infarction, stroke, pneumonia, respiratory failure and death. Patient understood these risks and wished to proceed.     Juanell FairlyKevin Adelita Hone, MD   10/01/2018 7:50 AM

## 2018-10-01 NOTE — OR Nursing (Deleted)
Dr. Kayleen Memos at bedside and looked at EKG he wanted to consult with cardiologist about the EKG compare to yesterdays, Dr. Saralyn Pilar he recommend off unit telemetry. Order being placed.

## 2018-10-02 ENCOUNTER — Observation Stay: Payer: BC Managed Care – PPO

## 2018-10-02 DIAGNOSIS — S76112A Strain of left quadriceps muscle, fascia and tendon, initial encounter: Secondary | ICD-10-CM | POA: Diagnosis not present

## 2018-10-02 LAB — CBC
HCT: 39.7 % (ref 39.0–52.0)
Hemoglobin: 12.6 g/dL — ABNORMAL LOW (ref 13.0–17.0)
MCH: 26.5 pg (ref 26.0–34.0)
MCHC: 31.7 g/dL (ref 30.0–36.0)
MCV: 83.6 fL (ref 80.0–100.0)
Platelets: 315 10*3/uL (ref 150–400)
RBC: 4.75 MIL/uL (ref 4.22–5.81)
RDW: 12.6 % (ref 11.5–15.5)
WBC: 11 10*3/uL — ABNORMAL HIGH (ref 4.0–10.5)
nRBC: 0 % (ref 0.0–0.2)

## 2018-10-02 LAB — URINALYSIS, ROUTINE W REFLEX MICROSCOPIC
Bacteria, UA: NONE SEEN
Bilirubin Urine: NEGATIVE
Glucose, UA: NEGATIVE mg/dL
Hgb urine dipstick: NEGATIVE
Ketones, ur: NEGATIVE mg/dL
Leukocytes,Ua: NEGATIVE
Nitrite: NEGATIVE
Protein, ur: NEGATIVE mg/dL
Specific Gravity, Urine: 1.012 (ref 1.005–1.030)
Squamous Epithelial / HPF: NONE SEEN (ref 0–5)
pH: 5 (ref 5.0–8.0)

## 2018-10-02 LAB — BASIC METABOLIC PANEL
Anion gap: 8 (ref 5–15)
BUN: 10 mg/dL (ref 6–20)
CO2: 25 mmol/L (ref 22–32)
Calcium: 8.4 mg/dL — ABNORMAL LOW (ref 8.9–10.3)
Chloride: 104 mmol/L (ref 98–111)
Creatinine, Ser: 0.99 mg/dL (ref 0.61–1.24)
GFR calc Af Amer: >60 mL/min (ref >60)
GFR calc non Af Amer: >60 mL/min (ref >60)
Glucose, Bld: 138 mg/dL — ABNORMAL HIGH (ref 70–99)
Potassium: 3.6 mmol/L (ref 3.5–5.1)
Sodium: 137 mmol/L (ref 135–145)

## 2018-10-02 LAB — LACTIC ACID, PLASMA: Lactic Acid, Venous: 1.7 mmol/L (ref 0.5–1.9)

## 2018-10-02 MED ORDER — OXYCODONE HCL 5 MG PO TABS
10.0000 mg | ORAL_TABLET | Freq: Once | ORAL | Status: AC
  Administered 2018-10-02: 10 mg via ORAL
  Filled 2018-10-02: qty 2

## 2018-10-02 MED ORDER — SODIUM CHLORIDE 0.9 % IV BOLUS
1000.0000 mL | Freq: Once | INTRAVENOUS | Status: AC
  Administered 2018-10-02: 1000 mL via INTRAVENOUS

## 2018-10-02 MED ORDER — SODIUM CHLORIDE 0.9 % IV BOLUS
500.0000 mL | Freq: Once | INTRAVENOUS | Status: AC
  Administered 2018-10-02: 500 mL via INTRAVENOUS

## 2018-10-02 MED ORDER — HYDRALAZINE HCL 20 MG/ML IJ SOLN
10.0000 mg | Freq: Four times a day (QID) | INTRAMUSCULAR | Status: DC | PRN
  Administered 2018-10-02: 10 mg via INTRAVENOUS
  Filled 2018-10-02: qty 1

## 2018-10-02 MED ORDER — IBUPROFEN 400 MG PO TABS
400.0000 mg | ORAL_TABLET | Freq: Once | ORAL | Status: AC
  Administered 2018-10-02: 400 mg via ORAL
  Filled 2018-10-02: qty 1

## 2018-10-02 NOTE — Progress Notes (Signed)
Md notified about elevated temperature and BP. Instructed to give scheduled tylenol early; MD also noted elevated BP was mostly due to pain

## 2018-10-02 NOTE — Progress Notes (Addendum)
Subjective:  POD #1 s/p left knee open patella tendon repair.   Patient reports left knee pain as moderate.  I ordered a hospitalist consult this morning after the patient's nurse, Ana, inform me that the patient had a fever of 101 overnight and has had persistent hypertension postop.  Objective:   VITALS:   Vitals:   10/02/18 0024 10/02/18 0227 10/02/18 0432 10/02/18 0819  BP: (!) 161/96 (!) 158/95 (!) 154/91 (!) 139/93  Pulse: (!) 113 (!) 106 (!) 116 93  Resp: 20 20 20 18   Temp: 100.3 F (37.9 C) 99.4 F (37.4 C) 98.5 F (36.9 C) 97.7 F (36.5 C)  TempSrc: Oral Oral Oral Oral  SpO2: 96% 97% 94% 97%  Weight:      Height:        PHYSICAL EXAM: Left lower extremity: I personally change the patient's dressings today.  Patient has minimal drainage on his honeycomb dressing.  Patient has expected swelling of his left knee postop.  There is no active drainage from his incision.  His leg and thigh compartments are soft and compressible.  He has palpable pedal pulses, intact sensation light touch and intact motor function distally.   LABS  Results for orders placed or performed during the hospital encounter of 10/01/18 (from the past 24 hour(s))  CBC     Status: Abnormal   Collection Time: 10/02/18  5:23 AM  Result Value Ref Range   WBC 11.0 (H) 4.0 - 10.5 K/uL   RBC 4.75 4.22 - 5.81 MIL/uL   Hemoglobin 12.6 (L) 13.0 - 17.0 g/dL   HCT 16.139.7 09.639.0 - 04.552.0 %   MCV 83.6 80.0 - 100.0 fL   MCH 26.5 26.0 - 34.0 pg   MCHC 31.7 30.0 - 36.0 g/dL   RDW 40.912.6 81.111.5 - 91.415.5 %   Platelets 315 150 - 400 K/uL   nRBC 0.0 0.0 - 0.2 %  Basic metabolic panel     Status: Abnormal   Collection Time: 10/02/18  5:23 AM  Result Value Ref Range   Sodium 137 135 - 145 mmol/L   Potassium 3.6 3.5 - 5.1 mmol/L   Chloride 104 98 - 111 mmol/L   CO2 25 22 - 32 mmol/L   Glucose, Bld 138 (H) 70 - 99 mg/dL   BUN 10 6 - 20 mg/dL   Creatinine, Ser 7.820.99 0.61 - 1.24 mg/dL   Calcium 8.4 (L) 8.9 - 10.3 mg/dL   GFR  calc non Af Amer >60 >60 mL/min   GFR calc Af Amer >60 >60 mL/min   Anion gap 8 5 - 15  Urinalysis, Routine w reflex microscopic     Status: Abnormal   Collection Time: 10/02/18  8:16 AM  Result Value Ref Range   Color, Urine YELLOW (A) YELLOW   APPearance CLEAR (A) CLEAR   Specific Gravity, Urine 1.012 1.005 - 1.030   pH 5.0 5.0 - 8.0   Glucose, UA NEGATIVE NEGATIVE mg/dL   Hgb urine dipstick NEGATIVE NEGATIVE   Bilirubin Urine NEGATIVE NEGATIVE   Ketones, ur NEGATIVE NEGATIVE mg/dL   Protein, ur NEGATIVE NEGATIVE mg/dL   Nitrite NEGATIVE NEGATIVE   Leukocytes,Ua NEGATIVE NEGATIVE   RBC / HPF 0-5 0 - 5 RBC/hpf   WBC, UA 0-5 0 - 5 WBC/hpf   Bacteria, UA NONE SEEN NONE SEEN   Squamous Epithelial / LPF NONE SEEN 0 - 5   Mucus PRESENT   Lactic acid, plasma     Status: None   Collection  Time: 10/02/18  9:23 AM  Result Value Ref Range   Lactic Acid, Venous 1.7 0.5 - 1.9 mmol/L    Dg Chest 2 View  Result Date: 10/02/2018 CLINICAL DATA:  Fever EXAM: CHEST - 2 VIEW COMPARISON:  None. FINDINGS: The heart size and mediastinal contours are within normal limits. Both lungs are clear. The visualized skeletal structures are unremarkable. IMPRESSION: No active cardiopulmonary disease. Electronically Signed   By: Davina Poke M.D.   On: 10/02/2018 10:57    Assessment/Plan: 1 Day Post-Op   Active Problems:   Patellar tendon rupture, left, initial encounter  The patient is currently afebrile.  He was seen by Dr. Darvin Neighbours today.  Blood cultures have been sent and are pending.  There is no evidence of UTI or pneumonia on his chest x-ray.  Patient will be monitored again overnight for fevers.  He is currently not having any respiratory symptoms.  I encouraged him to use his incentive spirometer 10 times an hour to treat any potential atelectasis.  Patient with low-grade fever may be related to postop hematoma.  Patient is touchdown weightbearing on the left lower extremity.  He must remain in his  knee brace locked in extension at all times.  He may not bend his left knee for any reason.  Continue Polar Care to help reduce swelling.  An Ace wrap was reapplied over his honeycomb dressing to help provide compression to his left knee to help with reduction in swelling.  Patient is on Lovenox 40 mg daily while in the hospital for DVT prophylaxis.    Thornton Park , MD 10/02/2018, 1:20 PM

## 2018-10-02 NOTE — Progress Notes (Signed)
MD Mack Guise made aware of pts vitals throughout the night. Pt states he "sometime feels lightheaded". MD placing hospitalist consult.

## 2018-10-02 NOTE — Progress Notes (Signed)
Physical Therapy Treatment Patient Details Name: Ryan Sawyer MRN: [id number] DOB: 1981/02/03 Today's Date: 10/02/2018    History of Present Illness Pt admitted for patellar tendon rupture from playing basketball with his nephews. He is now POD 1 s/p L patella tendon repair. History includes GERD and vertigo. Per secure chat from Dr. Raliegh Ip, he is TTWB on L LE.    PT Comments    Pt is making good progress towards goals, however is limited secondary to elevated vitals and high level of pain. BP at 167/11 upon arrival and HR increased to 170 with exertion. Safe technique with ambulation using RW, advise to continue RW use in home environment. Still requires assist for there-ex. Deferred stair training this date secondary to vitals. Will continue to progress as tolerated.   Follow Up Recommendations  Supervision for mobility/OOB;Follow surgeon's recommendation for DC plan and follow-up therapies     Equipment Recommendations  Rolling walker with 5" wheels    Recommendations for Other Services       Precautions / Restrictions Precautions Precautions: Fall Required Braces or Orthoses: Other Brace Other Brace: Hinged brace locked in extension. Restrictions Weight Bearing Restrictions: Yes LLE Weight Bearing: Touchdown weight bearing    Mobility  Bed Mobility Overal bed mobility: Needs Assistance Bed Mobility: Supine to Sit     Supine to sit: Min assist     General bed mobility comments: Pt moves with improved ease of technique, still needs assist for L LE. Once seated at EOB, demonstrates up right posture  Transfers Overall transfer level: Needs assistance Equipment used: Rolling walker (2 wheeled) Transfers: Sit to/from Stand Sit to Stand: Min guard         General transfer comment: safe technique with RW. Upright posture noted, maintains self NWB on L LE.  Ambulation/Gait Ambulation/Gait assistance: Min guard Gait Distance (Feet): 80 Feet Assistive device: Rolling  walker (2 wheeled) Gait Pattern/deviations: Step-to pattern     General Gait Details: Pt prefers NWB due to pain. Pt does well navigating RW with good safety awareness. Fatigues with increased distances. HR increases to 170bpm with exertion, deferred further ambulation.   Stairs             Wheelchair Mobility    Modified Rankin (Stroke Patients Only)       Balance Overall balance assessment: Needs assistance Sitting-balance support: Feet supported Sitting balance-Leahy Scale: Good     Standing balance support: Bilateral upper extremity supported Standing balance-Leahy Scale: Fair                              Cognition Arousal/Alertness: Awake/alert Behavior During Therapy: WFL for tasks assessed/performed Overall Cognitive Status: Within Functional Limits for tasks assessed                                        Exercises Other Exercises Other Exercises: supine ther-ex performed on L LE including AP, SLRs, hip abd/add, and glut sets. All ther-ex performed x 10 reps with min assist for SLRs. Cues given for correct technique. Other Exercises: Pt educated in falls prevention strategies, safe use of AE for LB ADL mgt, polar care mgt, compression stocking mgt, and pet mgt strategies on this date. Pt return verbalized understanding but would benefit from further reinforcement and opportunity to trial AE.    General Comments  Pertinent Vitals/Pain Pain Assessment: 0-10 Pain Score: 7  Pain Location: L LE Pain Descriptors / Indicators: Operative site guarding;Discomfort;Grimacing Pain Intervention(s): Limited activity within patient's tolerance;Repositioned;Premedicated before session;Ice applied    Home Living Family/patient expects to be discharged to:: Private residence Living Arrangements: Spouse/significant other Available Help at Discharge: Family;Available 24 hours/day Type of Home: House Home Access: Level entry   Home  Layout: Bed/bath upstairs;Two level Home Equipment: Crutches;Shower seat - built in;Hand held shower head Additional Comments: may be able to stay on couch/recliner on main floor.     Prior Function Level of Independence: Independent      Comments: not currently working. Indep prior, no falls   PT Goals (current goals can now be found in the care plan section) Acute Rehab PT Goals Patient Stated Goal: to go home PT Goal Formulation: With patient Time For Goal Achievement: 10/16/18 Potential to Achieve Goals: Good Progress towards PT goals: Progressing toward goals    Frequency    BID      PT Plan Current plan remains appropriate    Co-evaluation              AM-PAC PT "6 Clicks" Mobility   Outcome Measure  Help needed turning from your back to your side while in a flat bed without using bedrails?: None Help needed moving from lying on your back to sitting on the side of a flat bed without using bedrails?: A Little Help needed moving to and from a bed to a chair (including a wheelchair)?: A Little Help needed standing up from a chair using your arms (e.g., wheelchair or bedside chair)?: A Little Help needed to walk in hospital room?: A Little Help needed climbing 3-5 steps with a railing? : A Little 6 Click Score: 19    End of Session Equipment Utilized During Treatment: Gait belt Activity Tolerance: Patient tolerated treatment well Patient left: in chair;with chair alarm set;with SCD's reapplied Nurse Communication: Mobility status PT Visit Diagnosis: Unsteadiness on feet (R26.81);Muscle weakness (generalized) (M62.81);History of falling (Z91.81);Difficulty in walking, not elsewhere classified (R26.2);Pain Pain - Right/Left: Left Pain - part of body: Leg     Time: [id number] PT Time Calculation (min) (ACUTE ONLY): 23 min  Charges:  $Gait Training: 8-22 mins $Therapeutic Exercise: 8-22 mins                     Elizabeth PalauStephanie Calyn Rubi, PT,  DPT [phone]    Rona Tomson 10/02/2018, 3:11 PM

## 2018-10-02 NOTE — Progress Notes (Signed)
MD notified about elevated BP and HR as MEWS is a yellow. One time dose of 10 mg oxycodone ordered. Informed MD that I had just given 0.5 mg of dilaudid. Also informed MD that hinged brace seemed too tight and patient was complaining of pain in that area. Instructed by MD to loosen the hinged brace some. Will continue to monitor.

## 2018-10-02 NOTE — Consult Note (Signed)
[facility] [place] [facility] [place] NAME: Ryan Sawyer    MR#:  [id number]  DATE OF BIRTH:  December 14, 1980  DATE OF ADMISSION:  10/01/2018  PRIMARY CARE PHYSICIAN: Guadalupe Maple, MD   CONSULT REQUESTING/REFERRING PHYSICIAN: Dr. Mack Guise  REASON FOR CONSULT: Fever and elevated blood pressure  CHIEF COMPLAINT:  No chief complaint on file. Admitted for left patellar tendon repair  HISTORY OF PRESENT ILLNESS:  Ryan Sawyer  is a 38 y.o. male with a known history of GERD admitted to the hospital after having a left patellar tendon repair.  Patient was noticed to have multiple episodes of fever and elevated blood pressure overnight.  He has no shortness of breath or cough or rash or abdominal pain or dysuria.  Did have some nausea which is resolved.  Pain in his left knee is still present.  Normal lab work.  Lactic acid checked 10 normal.  Urinalysis normal.  Monitor chest 2 view which showed nothing acute.  COVID-19 test was -5 days back which was done for preoperative screening.  PAST MEDICAL HISTORY:   Past Medical History:  Diagnosis Date  . Club foot of both lower extremities   . Detached retina    right eye  . GERD (gastroesophageal reflux disease)   . Vertigo     PAST SURGICAL HISTOIRY:   Past Surgical History:  Procedure Laterality Date  . EYE SURGERY    . FOOT SURGERY    . PATELLAR TENDON REPAIR Left 10/01/2018   Procedure: PATELLA TENDON REPAIR;  Surgeon: Thornton Park, MD;  Location: [facility] ORS;  Service: Orthopedics;  Laterality: Left;    SOCIAL HISTORY:   Social History   Tobacco Use  . Smoking status: Never Smoker  . Smokeless tobacco: Never Used  Substance Use Topics  . Alcohol use: No    FAMILY HISTORY:   Family History  Problem Relation Age of Onset  . Diabetes Mother   . Hyperlipidemia Mother   . Hypertension Mother   . Healthy Father     DRUG ALLERGIES:  No Known Allergies  REVIEW OF SYSTEMS:    ROS  CONSTITUTIONAL: No fever, fatigue or weakness.  EYES: No blurred or double vision.  EARS, NOSE, AND THROAT: No tinnitus or ear pain.  RESPIRATORY: No cough, shortness of breath, wheezing or hemoptysis.  CARDIOVASCULAR: No chest pain, orthopnea, edema.  GASTROINTESTINAL: No nausea, vomiting, diarrhea or abdominal pain.  GENITOURINARY: No dysuria, hematuria.  ENDOCRINE: No polyuria, nocturia,  HEMATOLOGY: No anemia, easy bruising or bleeding SKIN: No rash or lesion. MUSCULOSKELETAL: Left knee pain NEUROLOGIC: No tingling, numbness, weakness.  PSYCHIATRY: No anxiety or depression.   MEDICATIONS AT HOME:   Prior to Admission medications   Medication Sig Start Date End Date Taking? Authorizing Provider  Multiple Vitamin (MULTIVITAMIN WITH MINERALS) TABS tablet Take 1 tablet by mouth daily.   Yes [provider]  omeprazole (PRILOSEC) 20 MG capsule Take 1 capsule (20 mg total) by mouth daily. 04/14/17  Yes Coral Spikes, DO  aspirin EC 325 MG tablet Take 1 tablet (325 mg total) by mouth 2 (two) times daily. 10/01/18   Thornton Park, MD  ondansetron (ZOFRAN) 4 MG tablet Take 1 tablet (4 mg total) by mouth every 8 (eight) hours as needed for nausea or vomiting. 10/01/18   Thornton Park, MD  oxyCODONE (OXY IR/ROXICODONE) 5 MG immediate release tablet Take 1 tablet (5 mg total) by mouth every 4 (four) hours as needed. 10/01/18   Mack Guise,  Caryn BeeKevin, MD      VITAL SIGNS:  Blood pressure (!) 139/93, pulse 93, temperature 97.7 F (36.5 C), temperature source Oral, resp. rate 18, height 5\' 9"  (1.753 m), weight 99.7 kg, SpO2 97 %.  PHYSICAL EXAMINATION:  GENERAL:  38 y.o.-year-old patient lying in the bed with no acute distress.  EYES: Pupils equal, round, reactive to light and accommodation. No scleral icterus. Extraocular muscles intact.  HEENT: Head atraumatic, normocephalic. Oropharynx and nasopharynx clear.  NECK:  Supple, no jugular venous distention. No thyroid  enlargement, no tenderness.  LUNGS: Normal breath sounds bilaterally, no wheezing, rales,rhonchi or crepitation. No use of accessory muscles of respiration.  CARDIOVASCULAR: S1, S2 normal. No murmurs, rubs, or gallops.  ABDOMEN: Soft, nontender, nondistended. Bowel sounds present. No organomegaly or mass.  EXTREMITIES: No pedal edema, cyanosis, or clubbing.  Left knee brace NEUROLOGIC: Cranial nerves II through XII are intact. Muscle strength 5/5 in all extremities. Sensation intact. Gait not checked.  PSYCHIATRIC: The patient is alert and oriented x 3.  SKIN: No obvious rash, lesion, or ulcer.   LABORATORY PANEL:   CBC Recent Labs  Lab 10/02/18 0523  WBC 11.0*  HGB 12.6*  HCT 39.7  PLT 315   ------------------------------------------------------------------------------------------------------------------  Chemistries  Recent Labs  Lab 10/02/18 0523  NA 137  K 3.6  CL 104  CO2 25  GLUCOSE 138*  BUN 10  CREATININE 0.99  CALCIUM 8.4*   ------------------------------------------------------------------------------------------------------------------  Cardiac Enzymes No results for input(s): TROPONINI in the last 168 hours. ------------------------------------------------------------------------------------------------------------------  RADIOLOGY:  No results found.  EKG:  No orders found for this or any previous visit.  IMPRESSION AND PLAN:   *Fever Etiology not clear at this time.  Chest x-ray clear.  Urinalysis normal.  Lab work normal.  Lactic acid normal.  COVID-19 test was negative.  No respiratory symptoms. Discussed with Dr. Martha ClanKrasinski if knee surgery side could be the source with may be a hematoma.  I did order blood cultures to rule out any infection.  No antibiotics at this time.  *Elevated blood pressure likely pain.  Reviewed prior ED visits and outpatient notes.  Blood pressure was normal.  Will monitor.  Not starting any medications.  If consistently  elevated will start metoprolol.  *Left patellar tendon repair.  Management per orthopedic team  DVT prophylaxis  All the records are reviewed and case discussed with Consulting provider. Management plans discussed with the patient, family and they are in agreement.  CODE STATUS: FULL CODE  TOTAL TIME TAKING CARE OF THIS PATIENT: 35 minutes.   Molinda BailiffSrikar R Nashly Olsson M.D on 10/02/2018 at 8:59 AM  Between 7am to 6pm - Pager - [phone]  After 6pm go to [email or web address] - password [email or web address] [facility]  [facility]  Office  [phone]  CC: Primary care Physician: Steele Sizerrissman, Mark A, MD     Note: This dictation was prepared with Dragon dictation along with smaller phrase technology. Any transcriptional errors that result from this process are unintentional.

## 2018-10-02 NOTE — Evaluation (Addendum)
Physical Therapy Evaluation Patient Details Name: Ryan Sawyer MRN: [id number] DOB: 06/17/1980 Today's Date: 10/02/2018   History of Present Illness  Pt admitted for patellar tendon rupture from playing basketball with his nephews. He is now POD 1 s/p L patella tendon repair. History includes GERD and vertigo. Per secure chat from Dr. Kirtland BouchardK, he is TTWB on L LE.  Clinical Impression  Pt is a pleasant 38 year old male who was admitted for L patella tendon repair. Pt performs bed mobility, transfers, and ambulation with min assist and crutches. IMproved balance/pain/technique with RW use. Recommend continued RW use in home environment. Pt demonstrates deficits with strength/pain/mobility. Would benefit from skilled PT to address above deficits and promote optimal return to PLOF. Recommend transition to HHPT upon discharge from acute hospitalization.     Follow Up Recommendations Supervision for mobility/OOB;Follow surgeon's recommendation for DC plan and follow-up therapies    Equipment Recommendations  Rolling walker with 5" wheels    Recommendations for Other Services       Precautions / Restrictions Precautions Precautions: Fall Required Braces or Orthoses: (hinged brace locked in extension) Restrictions Weight Bearing Restrictions: Yes LLE Weight Bearing: Touchdown weight bearing      Mobility  Bed Mobility Overal bed mobility: Needs Assistance Bed Mobility: Supine to Sit     Supine to sit: Min assist     General bed mobility comments: needs assist for sliding L LE off bed. ONce seated, upright posture maintained  Transfers Overall transfer level: Needs assistance Equipment used: Crutches Transfers: Sit to/from Stand Sit to Stand: Min assist         General transfer comment: assist for balance and cues for Wbing. INcreased pain noted with OOB mobility  Ambulation/Gait Ambulation/Gait assistance: Min assist Gait Distance (Feet): 8 Feet Assistive device:  Crutches Gait Pattern/deviations: Step-to pattern     General Gait Details: Pt educated on WBing, however prefers NWB due to pain. Unsteady with crutches with cues for positioning. Fatigues quickly  Information systems managertairs            Wheelchair Mobility    Modified Rankin (Stroke Patients Only)       Balance Overall balance assessment: Needs assistance Sitting-balance support: Feet supported Sitting balance-Leahy Scale: Good     Standing balance support: Bilateral upper extremity supported Standing balance-Leahy Scale: Fair                               Pertinent Vitals/Pain Pain Assessment: 0-10 Pain Score: 6  Pain Location: L LE Pain Descriptors / Indicators: Operative site guarding;Discomfort Pain Intervention(s): Limited activity within patient's tolerance;Premedicated before session;Repositioned;Ice applied    Home Living Family/patient expects to be discharged to:: Private residence Living Arrangements: Spouse/significant other Available Help at Discharge: Family;Available 24 hours/day Type of Home: House Home Access: Level entry     Home Layout: Bed/bath upstairs;Two level Home Equipment: Crutches Additional Comments: may be able to stay on couch/recliner on main floor.     Prior Function Level of Independence: Independent         Comments: not currently working. Indep prior, no falls     Hand Dominance        Extremity/Trunk Assessment   Upper Extremity Assessment Upper Extremity Assessment: Overall WFL for tasks assessed    Lower Extremity Assessment Lower Extremity Assessment: Generalized weakness(L LE grossly 3/5; R LE grossly 4+/5)       Communication   Communication: No difficulties  Cognition  Arousal/Alertness: Awake/alert Behavior During Therapy: WFL for tasks assessed/performed Overall Cognitive Status: Within Functional Limits for tasks assessed                                        General Comments       Exercises Other Exercises Other Exercises: supine ther-ex performed on L LE including AP, SLRs, hip abd/add, and glut sets. All ther-ex performed x 10 reps with min assist for SLRs. Cues given for correct technique. Other Exercises: Further ambulation performed with RW. Improved balance with decreased assist required- cga. Again, prefers to be NWB due to pain. Reports improved pain control with RW use. Ambulated 40', fatigues quickly.   Assessment/Plan    PT Assessment Patient needs continued PT services  PT Problem List Decreased strength;Decreased balance;Decreased mobility;Decreased knowledge of use of DME;Pain       PT Treatment Interventions DME instruction;Gait training;Stair training;Therapeutic exercise;Balance training    PT Goals (Current goals can be found in the Care Plan section)  Acute Rehab PT Goals Patient Stated Goal: to go home PT Goal Formulation: With patient Time For Goal Achievement: 10/16/18 Potential to Achieve Goals: Good    Frequency BID   Barriers to discharge        Co-evaluation               AM-PAC PT "6 Clicks" Mobility  Outcome Measure Help needed turning from your back to your side while in a flat bed without using bedrails?: None Help needed moving from lying on your back to sitting on the side of a flat bed without using bedrails?: A Little Help needed moving to and from a bed to a chair (including a wheelchair)?: A Little Help needed standing up from a chair using your arms (e.g., wheelchair or bedside chair)?: A Little Help needed to walk in hospital room?: A Little Help needed climbing 3-5 steps with a railing? : A Little 6 Click Score: 19    End of Session Equipment Utilized During Treatment: Gait belt Activity Tolerance: Patient tolerated treatment well Patient left: in bed;with bed alarm set;with SCD's reapplied(going to imaging) Nurse Communication: Mobility status PT Visit Diagnosis: Unsteadiness on feet (R26.81);Muscle  weakness (generalized) (M62.81);History of falling (Z91.81);Difficulty in walking, not elsewhere classified (R26.2);Pain Pain - Right/Left: Left Pain - part of body: Leg    Time: 0930-1005 PT Time Calculation (min) (ACUTE ONLY): 35 min   Charges:   PT Evaluation $PT Eval Low Complexity: 1 Low PT Treatments $Gait Training: 8-22 mins $Therapeutic Exercise: 8-22 mins        Greggory Stallion, PT, DPT [phone]   Ryan Sawyer 10/02/2018, 10:55 AM

## 2018-10-02 NOTE — Progress Notes (Signed)
BP 191/114, heart rate 110, Temperature 100.8. Hydralazine 5mg  given IV. Tylenol 3235 mg given po. Pt doing coughing, deep breathing, IS exercises. Will continue to monitor closely.

## 2018-10-02 NOTE — Progress Notes (Addendum)
Patient HR is 132 and Temp is 101.2 after Tylenol. Has paged [facility]  X 5 times and awaiting a call back. Will continue to monitor.

## 2018-10-02 NOTE — Progress Notes (Signed)
OT Cancellation Note  Patient Details Name: Ryan Sawyer MRN: [id number] DOB: [birth year]   Cancelled Treatment:    Reason Eval/Treat Not Completed: Pain limiting ability to participate. Thank you for the OT consult. Order received and chart reviewed. Upon arrival to pt room, pt endorsing significant pain. Stated he is waiting on his RN for pain medication and requests OT return at a later time. Will follow acutely and re-attempt at a later date/time as available and pt medically appropriate for OT eval.  Shara Blazing, M.S., OTR/L Ascom: [phone] 10/02/18, 11:36 AM

## 2018-10-02 NOTE — Evaluation (Signed)
Occupational Therapy Evaluation Patient Details Name: Ryan Sawyer MRN: [id number] DOB: September 19, 1980 Today's Date: 10/02/2018    History of Present Illness Pt admitted for patellar tendon rupture from playing basketball with his nephews. He is now POD 1 s/p L patella tendon repair. History includes GERD and vertigo. Per secure chat from Dr. Kirtland Bouchard, he is TTWB on L LE.   Clinical Impression   Ryan Sawyer was seen for OT evaluation this date, POD#1 from above surgery. Pt was active and independent in all ADLs prior to surgery. Pt is eager to return to PLOF with less pain and improved safety and independence. Pt currently requires moderate assist for LB dressing while in seated position due to pain and limited AROM of L knee. Pt instructed in polar care mgt, falls prevention strategies, home/routines modifications, DME/AE for LB bathing and dressing tasks, and compression stocking mgt. Pt would benefit from skilled OT services including additional instruction in dressing techniques with or without assistive devices for dressing and bathing skills to support recall and carryover prior to discharge and ultimately to maximize safety, independence, and minimize falls risk and caregiver burden. Recommend HHOT upon hospital DC to maximize pt safety and functional return to meaningful occupations of daily life.     Follow Up Recommendations  Home health OT    Equipment Recommendations  3 in 1 bedside commode    Recommendations for Other Services       Precautions / Restrictions Precautions Precautions: Fall Required Braces or Orthoses: Other Brace Other Brace: Hinged brace locked in extension. Restrictions Weight Bearing Restrictions: Yes LLE Weight Bearing: Touchdown weight bearing      Mobility Bed Mobility Overal bed mobility: Needs Assistance Bed Mobility: Supine to Sit     Supine to sit: Min assist     General bed mobility comments: Deferred. Pt up in recliner at start/end of session. Per  PT note, pt req min A for mgt of L LE off bed during sup>sit.  Transfers Overall transfer level: Needs assistance Equipment used: Crutches Transfers: Sit to/from Stand Sit to Stand: Min assist         General transfer comment: Per PT note, pt requires cues of WB status and min A for balance once standing.    Balance Overall balance assessment: Needs assistance Sitting-balance support: Feet supported Sitting balance-Leahy Scale: Good                                     ADL either performed or assessed with clinical judgement   ADL Overall ADL's : Needs assistance/impaired                                       General ADL Comments: Pt significantly pain limited at time of OT eval. Declined to attempt any ADLs on this date. Per chart, pt is moving fairly well using his crutches. Suspect mod assist for LB ADL mgt due to decreased ROM and increased pain in LLE. Pt able to self-feed with set-up assist. Using crutches for functional mobility.     Vision Baseline Vision/History: Wears glasses Wears Glasses: At all times Patient Visual Report: No change from baseline       Perception     Praxis      Pertinent Vitals/Pain Pain Assessment: 0-10 Pain Score: 8  Pain Location: L LE  Pain Descriptors / Indicators: Operative site guarding;Discomfort;Grimacing Pain Intervention(s): Limited activity within patient's tolerance;Monitored during session;Premedicated before session;Ice applied     Hand Dominance Right   Extremity/Trunk Assessment Upper Extremity Assessment Upper Extremity Assessment: Overall WFL for tasks assessed   Lower Extremity Assessment Lower Extremity Assessment: Defer to PT evaluation;LLE deficits/detail LLE Deficits / Details: s/p patella tendon rupture/repair LLE: Unable to fully assess due to immobilization;Unable to fully assess due to pain LLE Coordination: decreased gross motor;decreased fine motor       Communication  Communication Communication: No difficulties   Cognition Arousal/Alertness: Awake/alert Behavior During Therapy: WFL for tasks assessed/performed Overall Cognitive Status: Within Functional Limits for tasks assessed                                     General Comments       Exercises Other Exercises Other Exercises: Pt educated in falls prevention strategies, safe use of AE for LB ADL mgt, polar care mgt, compression stocking mgt, and pet mgt strategies on this date. Pt return verbalized understanding but would benefit from further reinforcement and opportunity to trial AE.   Shoulder Instructions      Home Living Family/patient expects to be discharged to:: Private residence Living Arrangements: Spouse/significant other Available Help at Discharge: Family;Available 24 hours/day Type of Home: House Home Access: Level entry     Home Layout: Bed/bath upstairs;Two level Alternate Level Stairs-Number of Steps: flight Alternate Level Stairs-Rails: Left Bathroom Shower/Tub: Tub/shower unit;Walk-in shower;Door   [employer] Toilet: Standard Bathroom Accessibility: Yes How Accessible: Accessible via walker Home Equipment: Crutches;Shower seat - built in;Hand held shower head   Additional Comments: may be able to stay on couch/recliner on main floor.       Prior Functioning/Environment Level of Independence: Independent        Comments: not currently working. Indep prior, no falls        OT Problem List: Decreased coordination;Decreased strength;Decreased range of motion;Decreased safety awareness;Decreased activity tolerance;Decreased knowledge of use of DME or AE;Decreased knowledge of precautions;Pain      OT Treatment/Interventions: Self-care/ADL training;Balance training;Therapeutic activities;Therapeutic exercise;DME and/or AE instruction;Patient/family education    OT Goals(Current goals can be found in the care plan section) Acute Rehab OT  Goals Patient Stated Goal: to go home OT Goal Formulation: With patient Time For Goal Achievement: 10/16/18 Potential to Achieve Goals: Good ADL Goals Pt Will Perform Lower Body Dressing: with adaptive equipment;sit to/from stand;with min assist(With LRAD PRN for improved safety and functional independence.) Pt Will Transfer to Toilet: ambulating;bedside commode(With LRAD PRN for improved safety and functional independence.) Pt Will Perform Toileting - Clothing Manipulation and hygiene: sit to/from stand;with modified independence(With LRAD PRN for improved safety and functional independence.)  OT Frequency: Min 1X/week   Barriers to D/C: Inaccessible home environment          Co-evaluation              AM-PAC OT "6 Clicks" Daily Activity     Outcome Measure Help from another person eating meals?: None Help from another person taking care of personal grooming?: None Help from another person toileting, which includes using toliet, bedpan, or urinal?: A Little Help from another person bathing (including washing, rinsing, drying)?: A Lot Help from another person to put on and taking off regular upper body clothing?: A Little Help from another person to put on and taking off regular lower body clothing?: A  Lot 6 Click Score: 18   End of Session    Activity Tolerance: Patient limited by pain Patient left: in chair;with call bell/phone within reach;with chair alarm set;Other (comment)(With polar care in place.)  OT Visit Diagnosis: Other abnormalities of gait and mobility (R26.89);Pain Pain - Right/Left: Left Pain - part of body: Knee;Leg                Time: 1404-1427 OT Time [id number] (min): 23 min Charges:  OT General Charges $OT Visit: 1 Visit OT Evaluation $OT Eval Low Complexity: 1 Low OT Treatments $Self Care/Home Management : 8-22 mins  Rockney GheeSerenity Mialee Weyman, M.S., OTR/L Ascom: [phone] 10/02/18, 3:10 PM

## 2018-10-02 NOTE — TOC Transition Note (Signed)
Transition of Care (TOC) - CM/SW Discharge Note   Patient Details  Name: Ryan Sawyer MRN: [id number] Date of Birth: 04/30/1980  Transition of Care (TOC) CM/SW Contact:  Deliliah J Gregory, RN Phone Number: 10/02/2018, 1:04 PM   Clinical Narrative:     Met with the patient to discuss needs, He lives at home with his spouse and is independent at home He has crutches at home but does better with a RW, I notified Brad with [employer] of the need He stated that he doesn't need HH Transportation is provided with family No other needs, he stated that he is up top date with PCP and cann afford his medications  Final next level of care: Home/Self Care Barriers to Discharge: Continued Medical Work up   Patient Goals and CMS Choice Patient states their goals for this hospitalization and ongoing recovery are:: go home      Discharge Placement                       Discharge Plan and Services   Discharge Planning Services: CM Consult            DME Arranged: Walker rolling DME Agency: [facility] Date DME Agency Contacted: 10/02/18 Time DME Agency Contacted: 1303 Representative spoke with at DME Agency: Brad HH Arranged: NA          Social Determinants of Health (SDOH) Interventions     Readmission Risk Interventions No flowsheet data found.     

## 2018-10-02 NOTE — Progress Notes (Signed)
Patient BP 161/96; HR 113; Patient also complaining of tightness around his thigh and some numbness in his toes on the operative side. MD notified. Orders  received to loosen the brace and to give 10 my oxycodone once. Continue with tylenol as prescribed for fever

## 2018-10-03 DIAGNOSIS — S76112A Strain of left quadriceps muscle, fascia and tendon, initial encounter: Secondary | ICD-10-CM | POA: Diagnosis not present

## 2018-10-03 MED ORDER — METOPROLOL SUCCINATE ER 25 MG PO TB24
25.0000 mg | ORAL_TABLET | Freq: Every day | ORAL | Status: DC
  Administered 2018-10-03: 25 mg via ORAL
  Filled 2018-10-03: qty 1

## 2018-10-03 MED ORDER — METOPROLOL SUCCINATE ER 25 MG PO TB24: 25.0000 mg | ORAL_TABLET | Freq: Every day | ORAL | 0 refills | Status: AC

## 2018-10-03 MED ORDER — DOCUSATE SODIUM 100 MG PO CAPS: 100.0000 mg | ORAL_CAPSULE | Freq: Two times a day (BID) | ORAL | 0 refills | Status: DC

## 2018-10-03 MED ORDER — ASPIRIN EC 325 MG PO TBEC: 325.0000 mg | DELAYED_RELEASE_TABLET | Freq: Two times a day (BID) | ORAL | Status: DC

## 2018-10-03 NOTE — Progress Notes (Signed)
Writer went over discharge instructions with wife and patient, both verbalized understanding.  Patient wheeled out to medical mall entrance with walker, crutches, polar care, and bedside commode.  New prescriptions were escribed to pharmacy.

## 2018-10-03 NOTE — Discharge Summary (Signed)
Physician Discharge Summary  Patient ID: Ryan Sawyer MRN: [id number] DOB/AGE: 1980-02-18 38 y.o.  Admit date: 10/01/2018 Discharge date: 10/03/2018  Admission Diagnoses:  S76.112D Strain of left quadriceps muscle, fascia and tendon <principal problem not specified>  Discharge Diagnoses:  S76.112D Strain of left quadriceps muscle, fascia and tendon Active Problems:   Patellar tendon rupture, left, initial encounter   Past Medical History:  Diagnosis Date  . Club foot of both lower extremities   . Detached retina    right eye  . GERD (gastroesophageal reflux disease)   . Vertigo     Surgeries: Procedure(s): PATELLA TENDON REPAIR on 10/01/2018   Consultants (if any):   Discharged Condition: Improved  Hospital Course: Ryan Sawyer is an 38 y.o. male who was admitted 10/01/2018 with a diagnosis of  S76.112D Strain of left quadriceps muscle, fascia and tendon <principal problem not specified> and went to the operating room on 10/01/2018 and underwent an uncomplicated left patella tendon repair.    He was given perioperative antibiotics:  Anti-infectives (From admission, onward)   Start     Dose/Rate Route Frequency Ordered Stop   10/01/18 1700  ceFAZolin (ANCEF) IVPB 1 g/50 mL premix     1 g 100 mL/hr over 30 Minutes Intravenous Every 6 hours 10/01/18 1408 10/01/18 2340   10/01/18 0603  ceFAZolin (ANCEF) 2-4 GM/100ML-% IVPB    Note to Pharmacy: [place]: cabinet override      10/01/18 0603 10/01/18 0812   10/01/18 0600  ceFAZolin (ANCEF) IVPB 2g/100 mL premix     2 g 200 mL/hr over 30 Minutes Intravenous On call to O.R. 10/01/18 0155 10/01/18 1047    .  He was given sequential compression devices, early ambulation, and Lovenox for DVT prophylaxis.  Patient spiked a fever to 101 F the night after surgery and had tachycardia.  A hospitalist consult was called.  Patient was given metoprolol XL for his tachycardia.  He was encouraged to use his incentive  spirometer and did not have further episodes of fevers.  His urine, chest x-ray and blood cultures were negative.  Patient made good progress with physical therapy.  His pain is controlled now on postop day #2 and he is prepared for discharge home.  He benefited maximally from the hospital stay and there were no complications.    Recent vital signs:  Vitals:   10/03/18 0758 10/03/18 1125  BP: (!) 155/102 (!) 151/111  Pulse: (!) 108 (!) 120  Resp: 17 16  Temp: 99 F (37.2 C) 99.6 F (37.6 C)  SpO2: 99% 100%    Recent laboratory studies:  Lab Results  Component Value Date   HGB 12.6 (L) 10/02/2018   HGB 13.6 10/01/2018   Lab Results  Component Value Date   WBC 11.0 (H) 10/02/2018   PLT 315 10/02/2018   Lab Results  Component Value Date   INR 0.9 10/01/2018   Lab Results  Component Value Date   NA 137 10/02/2018   K 3.6 10/02/2018   CL 104 10/02/2018   CO2 25 10/02/2018   BUN 10 10/02/2018   CREATININE 0.99 10/02/2018   GLUCOSE 138 (H) 10/02/2018    Discharge Medications:   Allergies as of 10/03/2018   No Known Allergies     Medication List    TAKE these medications   aspirin EC 325 MG tablet Take 1 tablet (325 mg total) by mouth 2 (two) times daily.   docusate sodium 100 MG capsule Commonly known as:  COLACE Take 1 capsule (100 mg total) by mouth 2 (two) times daily.   metoprolol succinate 25 MG 24 hr tablet Commonly known as: TOPROL-XL Take 1 tablet (25 mg total) by mouth daily.   multivitamin with minerals Tabs tablet Take 1 tablet by mouth daily.   omeprazole 20 MG capsule Commonly known as: PRILOSEC Take 1 capsule (20 mg total) by mouth daily.   ondansetron 4 MG tablet Commonly known as: Zofran Take 1 tablet (4 mg total) by mouth every 8 (eight) hours as needed for nausea or vomiting.   oxyCODONE 5 MG immediate release tablet Commonly known as: Oxy IR/ROXICODONE Take 1 tablet (5 mg total) by mouth every 4 (four) hours as needed.             Durable Medical Equipment  (From admission, onward)         Start     Ordered   10/02/18 1259  For home use only DME Walker rolling  Once    Question:  Patient needs a walker to treat with the following condition  Answer:  Patellar tendon rupture, left, initial encounter   10/02/18 1300          Diagnostic Studies: Dg Chest 2 View  Result Date: 10/02/2018 CLINICAL DATA:  Fever EXAM: CHEST - 2 VIEW COMPARISON:  None. FINDINGS: The heart size and mediastinal contours are within normal limits. Both lungs are clear. The visualized skeletal structures are unremarkable. IMPRESSION: No active cardiopulmonary disease. Electronically Signed   By: Duanne Guess M.D.   On: 10/02/2018 10:57    Disposition:   Discharge Instructions    Call MD / Call 911   Complete by: As directed    If you experience chest pain or shortness of breath, CALL 911 and be transported to the hospital emergency room.  If you develope a fever above 101 F, pus (white drainage) or increased drainage or redness at the wound, or calf pain, call your surgeon's office.   Constipation Prevention   Complete by: As directed    Drink plenty of fluids.  Prune juice may be helpful.  You may use a stool softener, such as Colace (over the counter) 100 mg twice a day.  Use MiraLax (over the counter) for constipation as needed.   Diet general   Complete by: As directed    Discharge instructions   Complete by: As directed    Patient should leave dressing and brace on and intact until follow-up.  He can cover the brace and dressing with a plastic bag for showers.  Patient's brace is to remain locked in extension until his follow-up in the office in 7 to 10 days.  Patient should continue strict elevation of left lower extremity only getting up to use the bathroom or kitchen.  He may use the Polar Care continuously for swelling and pain control.  Patient is toe-touch weightbearing on the left lower extremity with his crutches or  walker.  He will take enteric-coated aspirin 325 mg by mouth twice a day for DVT prophylaxis.  Patient should contact Dr. Samuel Germany office with any questions or concerns he may have.   Driving restrictions   Complete by: As directed    No driving until follow-up with Dr. Martha Clan.   Increase activity slowly as tolerated   Complete by: As directed    Lifting restrictions   Complete by: As directed    No lifting for 12-16 weeks      Follow-up Information  Juanell FairlyKrasinski, Cecil Vandyke, MD Follow up.   Specialty: Orthopedic Surgery Why: October 09, 2018 @ 11:30 am  Contact information: [address] [place] [address] [phone]        Steele Sizerrissman, Mark A, MD Follow up in 1 week(s).   Specialty: Family Medicine Why: HTN Contact information: [address] [place] [address] [phone]            Signed: Juanell FairlyKevin Mariangela Heldt ,MD 10/03/2018, 12:59 PM

## 2018-10-03 NOTE — Progress Notes (Signed)
  Subjective:  POD #2 s/p left patella tendon repair.   Patient reports left knee pain as mild to moderate.  Patient up out of bed to a chair.  His wife is at the bedside.  Patient denies any shortness of breath or chest pain.  He has not had any fevers overnight.  Patient still has mild tachycardia for which the hospitalist has prescribed him Toprol XL.  Objective:   VITALS:   Vitals:   10/03/18 0124 10/03/18 0438 10/03/18 0758 10/03/18 1125  BP: 135/87 132/88 (!) 155/102 (!) 151/111  Pulse: (!) 114 (!) 103 (!) 108 (!) 120  Resp: 18 18 17 16   Temp: 98.6 F (37 C) 99.1 F (37.3 C) 99 F (37.2 C) 99.6 F (37.6 C)  TempSrc: Oral Oral Oral Oral  SpO2: 98% 99% 99% 100%  Weight:      Height:        PHYSICAL EXAM: Left lower extremity: Patient has his telescoping knee brace locked in extension.  A Polar Care is on his left knee.  Dressings remain clean dry and intact today. Neurovascular intact Sensation intact distally Intact pulses distally Dorsiflexion/Plantar flexion intact Incision: dressing C/D/I No cellulitis present Compartment soft  LABS  No results found for this or any previous visit (from the past 24 hour(s)).  Dg Chest 2 View  Result Date: 10/02/2018 CLINICAL DATA:  Fever EXAM: CHEST - 2 VIEW COMPARISON:  None. FINDINGS: The heart size and mediastinal contours are within normal limits. Both lungs are clear. The visualized skeletal structures are unremarkable. IMPRESSION: No active cardiopulmonary disease. Electronically Signed   By: Davina Poke M.D.   On: 10/02/2018 10:57    Assessment/Plan: 2 Days Post-Op   Active Problems:   Patellar tendon rupture, left, initial encounter  Patient doing postop.  He remain toe-touch weightbearing until his follow-up with me in the office.  Patient will continue to wear his brace at all times.  He may leave his dressing in place until his follow-up in the office.  Patient will continue to use his Polar Care and elevate his  left lower extremity to avoid significant swelling.  He will take enteric-coated aspirin 325 mg p.o. twice daily for DVT prophylaxis.  Patient will follow-up with me in approximately 10 days.  His wife is already picked up his pain medicine at the pharmacy.    Thornton Park , MD 10/03/2018, 12:52 PM

## 2018-10-03 NOTE — TOC Transition Note (Signed)
Transition of Care [facility]) - CM/SW Discharge Note   Patient Details  Name: Ryan Sawyer MRN: [id number] Date of Birth: 18-Aug-1980  Transition of Care [facility]) CM/SW Contact:  Su Hilt, RN Phone Number: 10/03/2018, 2:29 PM   Clinical Narrative:     Patient discharging home with Wife and will do PT/OT outpatient with [facility].  RW and BSC provided by [employer] and in the room  Final next level of care: Home/Self Care Barriers to Discharge: Barriers Resolved   Patient Goals and CMS Choice Patient states their goals for this hospitalization and ongoing recovery are:: go home      Discharge Placement                       Discharge Plan and Services   Discharge Planning Services: CM Consult            DME Arranged: 3-N-1, [place] rolling DME Agency: [employer] Date DME Agency Contacted: 10/03/18 Time DME Agency Contacted: [phone] Representative spoke with at DME Agency: [place]: PT, OT [place] Agency: [place] ([place]) Date [place]: 10/03/18 Time [place]: [phone] Representative spoke with at [place]: [place] ([place]) Interventions     Readmission Risk Interventions No flowsheet data found.

## 2018-10-03 NOTE — Progress Notes (Signed)
[facility] - [place] at [facility]   PATIENT NAME: Ryan Sawyer    MR#:  [id number]  DATE OF BIRTH:  12/20/1980  SUBJECTIVE:  CHIEF COMPLAINT:  No chief complaint on file.  Left knee pain  Worked with PT and had tachycardia  REVIEW OF SYSTEMS:    Review of Systems  Constitutional: Positive for malaise/fatigue. Negative for chills and fever.  HENT: Negative for sore throat.   Eyes: Negative for blurred vision, double vision and pain.  Respiratory: Negative for cough, hemoptysis, shortness of breath and wheezing.   Cardiovascular: Negative for chest pain, palpitations, orthopnea and leg swelling.  Gastrointestinal: Negative for abdominal pain, constipation, diarrhea, heartburn, nausea and vomiting.  Genitourinary: Negative for dysuria and hematuria.  Musculoskeletal: Positive for joint pain. Negative for back pain.  Skin: Negative for rash.  Neurological: Negative for sensory change, speech change, focal weakness and headaches.  Endo/Heme/Allergies: Does not bruise/bleed easily.  Psychiatric/Behavioral: Negative for depression. The patient is not nervous/anxious.     DRUG ALLERGIES:  No Known Allergies  VITALS:  Blood pressure (!) 151/111, pulse (!) 120, temperature 99.6 F (37.6 C), temperature source Oral, resp. rate 16, height 5\' 9"  (1.753 m), weight 99.7 kg, SpO2 100 %.  PHYSICAL EXAMINATION:   Physical Exam  GENERAL:  38 y.o.-year-old patient lying in the bed with no acute distress.  EYES: Pupils equal, round, reactive to light and accommodation. No scleral icterus. Extraocular muscles intact.  HEENT: Head atraumatic, normocephalic. Oropharynx and nasopharynx clear.  NECK:  Supple, no jugular venous distention. No thyroid enlargement, no tenderness.  LUNGS: Normal breath sounds bilaterally, no wheezing, rales, rhonchi. No use of accessory muscles of respiration.  CARDIOVASCULAR: S1, S2 normal. No murmurs, rubs, or gallops.  ABDOMEN: Soft, nontender,  nondistended. Bowel sounds present. No organomegaly or mass.  EXTREMITIES: No cyanosis, clubbing or edema b/l.   Left knee dressing and brace NEUROLOGIC: Cranial nerves II through XII are intact. No focal Motor or sensory deficits b/l.   PSYCHIATRIC: The patient is alert and oriented x 3.  SKIN: No obvious rash, lesion, or ulcer.   LABORATORY PANEL:   CBC Recent Labs  Lab 10/02/18 0523  WBC 11.0*  HGB 12.6*  HCT 39.7  PLT 315   ------------------------------------------------------------------------------------------------------------------ Chemistries  Recent Labs  Lab 10/02/18 0523  NA 137  K 3.6  CL 104  CO2 25  GLUCOSE 138*  BUN 10  CREATININE 0.99  CALCIUM 8.4*   ------------------------------------------------------------------------------------------------------------------  Cardiac Enzymes No results for input(s): TROPONINI in the last 168 hours. ------------------------------------------------------------------------------------------------------------------  RADIOLOGY:  Dg Chest 2 View  Result Date: 10/02/2018 CLINICAL DATA:  Fever EXAM: CHEST - 2 VIEW COMPARISON:  None. FINDINGS: The heart size and mediastinal contours are within normal limits. Both lungs are clear. The visualized skeletal structures are unremarkable. IMPRESSION: No active cardiopulmonary disease. Electronically Signed   By: Duanne GuessNicholas  Plundo M.D.   On: 10/02/2018 10:57   ASSESSMENT AND PLAN:   * Fever Etiology not clear at this time.  Chest x-ray clear.  Urinalysis normal.  Lab work normal.  Lactic acid normal.  COVID-19 test was negative.  No respiratory symptoms. Could be post op fever  * HTN Consistent elevation in blood pressure.  Will start Toprol-XL 25 mg.  Follow-up with primary care physician.  Cussed with patient.  * Left patellar tendon repair.  Management per orthopedic team.  * Sinus tachycarida Afebrile.  Normal lactic acid.  Intermittent and increased with ambulation.   Starting Toprol-XL for his  hypertension and should help with his tachycardia.  * DVT prophylaxis  All the records are reviewed and case discussed with Care Management/Social Worker Management plans discussed with the patient, family and they are in agreement.  CODE STATUS: FULL CODE  TOTAL TIME TAKING CARE OF THIS PATIENT: 35 minutes   POSSIBLE D/C TODAY  Leia Alf Garvin Ellena M.D on 10/03/2018 at 11:45 AM  Between 7am to 6pm - Pager - [phone]  After 6pm go to [email or web address] - password [email or web address] [facility]  Office  [phone]  CC: Primary care physician; Guadalupe Maple, MD  Note: This dictation was prepared with Dragon dictation along with smaller phrase technology. Any transcriptional errors that result from this process are unintentional.

## 2018-10-03 NOTE — TOC Progression Note (Signed)
Transition of Care [facility]) - Progression Note    Patient Details  Name: Ryan Sawyer MRN: [id number] Date of Birth: Jun 09, 1980  [facility]) CM/SW Contact  Su Hilt, RN Phone Number: 10/03/2018, 9:05 AM  Clinical Narrative:    After the patient worked with OT, they determined he would benefit from a 3 in 1, I notified Brad to add this to the DME needs The patient will also Benefit from Home health PT and OT, notified Corene Cornea with [facility] and awaiting acceptance   Expected Discharge Plan: [place] Barriers to Discharge: Continued Medical Work up  Expected Discharge Plan and Services Expected Discharge Plan: [place]   Discharge Planning Services: CM Consult   Living arrangements for the past 2 months: Single Family Home Expected Discharge Date: 10/01/18               DME Arranged: Berta Minor rolling DME Agency: [employer] Date DME Agency Contacted: 10/03/18 Time DME Agency Contacted: [phone] Representative spoke with at DME Agency: [place]: PT, OT [place] Agency: [place] ([place]) Date [place]: 10/03/18 Time [place]: [phone] Representative spoke with at [place]: [place] ([place]) Interventions    Readmission Risk Interventions No flowsheet data found.

## 2018-10-03 NOTE — Progress Notes (Signed)
Physical Therapy Treatment Patient Details Name: Ryan Sawyer MRN: [id number] DOB: 1980/09/01 Today's Date: 10/03/2018    History of Present Illness 38 y/o male here after patellar tendon rupture while playing basketball with his nephews. s/p L patella tendon repair. History includes GERD and vertigo. Per secure chat from Dr. Raliegh Ip, he is TTWB on L LE.    PT Comments    Pt did better with mobility and safety/confidence with ambulation with walker.  He maintained a resting HR ~120 t/o session with increase to nearly 150 in getting to EOB with considerable pain, then later 180 bpm after ~65 ft of ambulation.  Pt showed good confidence and execution with quad engagement, but is unable to lift LLE against gravity w/o UE assist.  Pt safe regarding mobility, however cardiovascular issues precluded further activity this session.  Follow Up Recommendations  Follow surgeon's recommendation for DC plan and follow-up therapies     Equipment Recommendations  Rolling walker with 5" wheels    Recommendations for Other Services       Precautions / Restrictions Precautions Precautions: Fall Other Brace: Hinged brace locked in extension. Restrictions Weight Bearing Restrictions: Yes LLE Weight Bearing: Touchdown weight bearing    Mobility  Bed Mobility Overal bed mobility: Modified Independent Bed Mobility: Supine to Sit     Supine to sit: Min guard     General bed mobility comments: Using UEs on brace he was able to shift LE off EOB but had a lot of initial pain and HR up to nearly 150 with the effort  Transfers Overall transfer level: Modified independent Equipment used: Rolling walker (2 wheeled) Transfers: Sit to/from Stand Sit to Stand: Min guard         General transfer comment: cues for positioning, UE use and sequencing, able to rise from low bed w/o assist  Ambulation/Gait Ambulation/Gait assistance: Min guard Gait Distance (Feet): 65 Feet Assistive device: Rolling walker  (2 wheeled)       General Gait Details: Pt again self-selected NWBing initially, cues to do very light foot flat to at least simulate walking cadence, able to do well after initial cuing. He showed good effort and did not have excessive fatigue but when we stopped to check vitals HR was up to 180s, his O2 remained in the high 90s the entire time.   Stairs             Wheelchair Mobility    Modified Rankin (Stroke Patients Only)       Balance Overall balance assessment: Modified Independent   Sitting balance-Leahy Scale: Good Sitting balance - Comments: pt leaning secondary to L KI but easily maintains balance     Standing balance-Leahy Scale: Good Standing balance comment: Adjusted RW to appropriate height, good balance in standing                            Cognition Arousal/Alertness: Awake/alert Behavior During Therapy: WFL for tasks assessed/performed Overall Cognitive Status: Within Functional Limits for tasks assessed                                        Exercises General Exercises - Lower Extremity Ankle Circles/Pumps: Strengthening;10 reps Quad Sets: Strengthening;10 reps Hip ABduction/ADduction: Strengthening;10 reps Straight Leg Raises: AAROM;10 reps(unable to initiate against gravity mvt w/o assist)    General Comments  Pertinent Vitals/Pain Pain Assessment: 0-10 Pain Score: 9     Home Living                      Prior Function            PT Goals (current goals can now be found in the care plan section) Progress towards PT goals: Progressing toward goals    Frequency    BID      PT Plan Current plan remains appropriate    Co-evaluation              AM-PAC PT "6 Clicks" Mobility   Outcome Measure  Help needed turning from your back to your side while in a flat bed without using bedrails?: None Help needed moving from lying on your back to sitting on the side of a flat bed  without using bedrails?: None Help needed moving to and from a bed to a chair (including a wheelchair)?: A Little Help needed standing up from a chair using your arms (e.g., wheelchair or bedside chair)?: A Little Help needed to walk in hospital room?: A Little Help needed climbing 3-5 steps with a railing? : A Little 6 Click Score: 20    End of Session Equipment Utilized During Treatment: Gait belt Activity Tolerance: Patient limited by fatigue;Treatment limited secondary to medical complications (Comment) Patient left: with chair alarm set;with call bell/phone within reach;with family/visitor present   PT Visit Diagnosis: Unsteadiness on feet (R26.81);Muscle weakness (generalized) (M62.81);History of falling (Z91.81);Difficulty in walking, not elsewhere classified (R26.2);Pain Pain - Right/Left: Left Pain - part of body: Leg     Time: 1000-1049 PT Time Calculation (min) (ACUTE ONLY): 49 min  Charges:  $Gait Training: 8-22 mins $Therapeutic Exercise: 8-22 mins $Therapeutic Activity: 8-22 mins                     Malachi ProGalen R Daud Cayer, DPT 10/03/2018, 1:40 PM

## 2018-10-03 NOTE — Progress Notes (Signed)
Occupational Therapy Treatment Patient Details Name: Ryan Sawyer MRN: [id number] DOB: [birth year] Today's Date: 10/03/2018    History of present illness 38 y/o male here after patellar tendon rupture while playing basketball with his nephews. s/p L patella tendon repair. History includes GERD and vertigo. Per secure chat from Dr. Raliegh Ip, he is TTWB on L LE.   OT comments  Mr. Ballin was seen for OT treatment on this date. Upon arrival to room pt awake/alert seated in room recliner with wife at bedside. Pt A&O x 4 reporting 7/10 pain. He reports he had just spoken with his RN about medication. Pt agreeable to OT tx on this date. Pt and wife provided with reinforcement on prior education in polar care mgt, falls prevention strategies, and AE for LB ADL mgt. Pt and caregiver verbalized understanding of instruction provided. Pt wife stated she feels comfortable support pt during ADL tasks. Pt making good progress toward goals. Pt continues to benefit from skilled OT services to maximize return to PLOF and minimize risk of future falls, injury, caregiver burden, and readmission. Will continue to follow POC. Discharge recommendation updated based on pt progress with mobility and caregiver support.    Follow Up Recommendations  Follow surgeon's recommendation for DC plan and follow-up therapies    Equipment Recommendations  3 in 1 bedside commode    Recommendations for Other Services      Precautions / Restrictions Precautions Precautions: Fall Required Braces or Orthoses: Other Brace Other Brace: Hinged brace locked in extension. Restrictions Weight Bearing Restrictions: Yes LLE Weight Bearing: Touchdown weight bearing       Mobility Bed Mobility Overal bed mobility: Modified Independent Bed Mobility: Supine to Sit     Supine to sit: Min guard     General bed mobility comments: Using UEs on brace he was able to shift LE off EOB but had a lot of initial pain and HR up to nearly 150 with  the effort  Transfers Overall transfer level: Modified independent Equipment used: Rolling walker (2 wheeled) Transfers: Sit to/from Stand Sit to Stand: Min guard         General transfer comment: cues for positioning, UE use and sequencing, able to rise from low bed w/o assist    Balance Overall balance assessment: Modified Independent Sitting-balance support: Feet supported Sitting balance-Leahy Scale: Good Sitting balance - Comments: pt leaning secondary to L KI but easily maintains balance     Standing balance-Leahy Scale: Good Standing balance comment: Adjusted RW to appropriate height, good balance in standing                           ADL either performed or assessed with clinical judgement   ADL Overall ADL's : Needs assistance/impaired                                       General ADL Comments: Pt continues to be limited by pain. OT reviewed AE for ADL mgt, but pt declined to trial on this date. Continues to require mod assist for LB ADL mgt. Wife in room at time of session, feels confident providing assistance to support ADLs in the home..     Vision Baseline Vision/History: Wears glasses Wears Glasses: At all times Patient Visual Report: No change from baseline     Perception     Praxis  Cognition Arousal/Alertness: Awake/alert Behavior During Therapy: WFL for tasks assessed/performed Overall Cognitive Status: Within Functional Limits for tasks assessed                                          Exercises Exercises: General Lower Extremity General Exercises - Lower Extremity Ankle Circles/Pumps: Strengthening;10 reps Quad Sets: Strengthening;10 reps Hip ABduction/ADduction: Strengthening;10 reps Straight Leg Raises: AAROM;10 reps(unable to initiate against gravity mvt w/o assist) Other Exercises Other Exercises: Pt and caregiver provided with reinforcement of prior education in falls prevention  strategies, safe use of AE for LB ADL mgt, polar care mgt, & compression stocking mgt. Pt and caregiver return verbalized understanding of education provided.   Shoulder Instructions       General Comments      Pertinent Vitals/ Pain       Pain Assessment: 0-10 Pain Score: 7  Pain Location: L LE Pain Descriptors / Indicators: Operative site guarding;Discomfort;Grimacing;Sore Pain Intervention(s): Limited activity within patient's tolerance;Monitored during session;Other (comment)(Pt reported he had just called RN to request medication.)  Home Living                                          Prior Functioning/Environment              Frequency  Min 1X/week        Progress Toward Goals  OT Goals(current goals can now be found in the care plan section)  Progress towards OT goals: Progressing toward goals  Acute Rehab OT Goals Patient Stated Goal: to go home OT Goal Formulation: With patient Time For Goal Achievement: 10/16/18 Potential to Achieve Goals: Good  Plan Discharge plan remains appropriate;Discharge plan needs to be updated    Co-evaluation                 AM-PAC OT "6 Clicks" Daily Activity     Outcome Measure   Help from another person eating meals?: None Help from another person taking care of personal grooming?: None Help from another person toileting, which includes using toliet, bedpan, or urinal?: A Little Help from another person bathing (including washing, rinsing, drying)?: A Little Help from another person to put on and taking off regular upper body clothing?: A Little Help from another person to put on and taking off regular lower body clothing?: A Little 6 Click Score: 20    End of Session    OT Visit Diagnosis: Other abnormalities of gait and mobility (R26.89);Pain Pain - Right/Left: Left Pain - part of body: Knee;Leg   Activity Tolerance Patient limited by pain   Patient Left in chair;with call bell/phone  within reach;with chair alarm set;Other (comment);with family/visitor present   Nurse Communication          Time: [phone] OT Time Calculation (min): 13 min  Charges: OT General Charges $OT Visit: 1 Visit OT Treatments $Self Care/Home Management : 8-22 mins  Rockney GheeSerenity Talishia Betzler, M.S., OTR/L Ascom: [phone] 10/03/18, 2:25 PM

## 2018-10-04 NOTE — Anesthesia Postprocedure Evaluation (Signed)
Anesthesia Post Note  Patient: Ryan Sawyer  Procedure(s) Performed: PATELLA TENDON REPAIR (Left Knee)  Patient location during evaluation: PACU Anesthesia Type: General Level of consciousness: awake and alert and oriented Pain management: pain level controlled Vital Signs Assessment: post-procedure vital signs reviewed and stable Respiratory status: spontaneous breathing Cardiovascular status: blood pressure returned to baseline Anesthetic complications: no     Last Vitals:  Vitals:   10/03/18 1125 10/03/18 1342  BP: (!) 151/111 (!) 137/99  Pulse: (!) 120 (!) 104  Resp: 16   Temp: 37.6 C 37.2 C  SpO2: 100% 100%    Last Pain:  Vitals:   10/03/18 1342  TempSrc: Oral  PainSc:                  Janine Reller

## 2018-10-07 LAB — CULTURE, BLOOD (ROUTINE X 2)
Culture: NO GROWTH
Culture: NO GROWTH
Special Requests: ADEQUATE
Special Requests: ADEQUATE

## 2018-10-10 ENCOUNTER — Inpatient Hospital Stay: Payer: BLUE CROSS/BLUE SHIELD | Admitting: Family Medicine

## 2019-05-17 ENCOUNTER — Ambulatory Visit: Payer: Self-pay | Attending: Internal Medicine

## 2019-05-17 DIAGNOSIS — Z23 Encounter for immunization: Secondary | ICD-10-CM

## 2019-05-17 NOTE — Progress Notes (Signed)
   Covid-19 Vaccination Clinic  Name:  Ryan Sawyer    MRN: [id number] DOB: 1980-03-21  05/17/2019  Ryan Sawyer was observed post Covid-19 immunization for 15 minutes without incident. He was provided with Vaccine Information Sheet and instruction to access the V-Safe system.   Ryan Sawyer was instructed to call 911 with any severe reactions post vaccine: Marland Kitchen Difficulty breathing  . Swelling of face and throat  . A fast heartbeat  . A bad rash all over body  . Dizziness and weakness   Immunizations Administered    Name Date Dose VIS Date Route   Pfizer COVID-19 Vaccine 05/17/2019 11:52 AM 0.3 mL 01/17/2019 Intramuscular   Manufacturer: [employer], [employer]   Lot: [id number]   NDC: [address]-4

## 2019-06-14 ENCOUNTER — Ambulatory Visit: Payer: Self-pay | Attending: Internal Medicine

## 2019-06-14 DIAGNOSIS — Z23 Encounter for immunization: Secondary | ICD-10-CM

## 2019-06-14 NOTE — Progress Notes (Signed)
   Covid-19 Vaccination Clinic  Name:  KALMAN NYLEN    MRN: [id number] DOB: 02-Oct-1980  06/14/2019  Mr. Ordaz was observed post Covid-19 immunization for 15 minutes without incident. He was provided with Vaccine Information Sheet and instruction to access the V-Safe system.   Mr. Venezia was instructed to call 911 with any severe reactions post vaccine: Marland Kitchen Difficulty breathing  . Swelling of face and throat  . A fast heartbeat  . A bad rash all over body  . Dizziness and weakness   Immunizations Administered    Name Date Dose VIS Date Route   Pfizer COVID-19 Vaccine 06/14/2019 11:22 AM 0.3 mL 04/02/2018 Intramuscular   Manufacturer: [employer], [employer]   Lot: [id number]   NDC: [address]-6

## 2020-12-31 IMAGING — CR CHEST - 2 VIEW
2 series · 2 of 2 positions shown · non-contrast
Comparison: None.

CLINICAL DATA: Fever

EXAM:
CHEST - 2 VIEW

[chest lat]
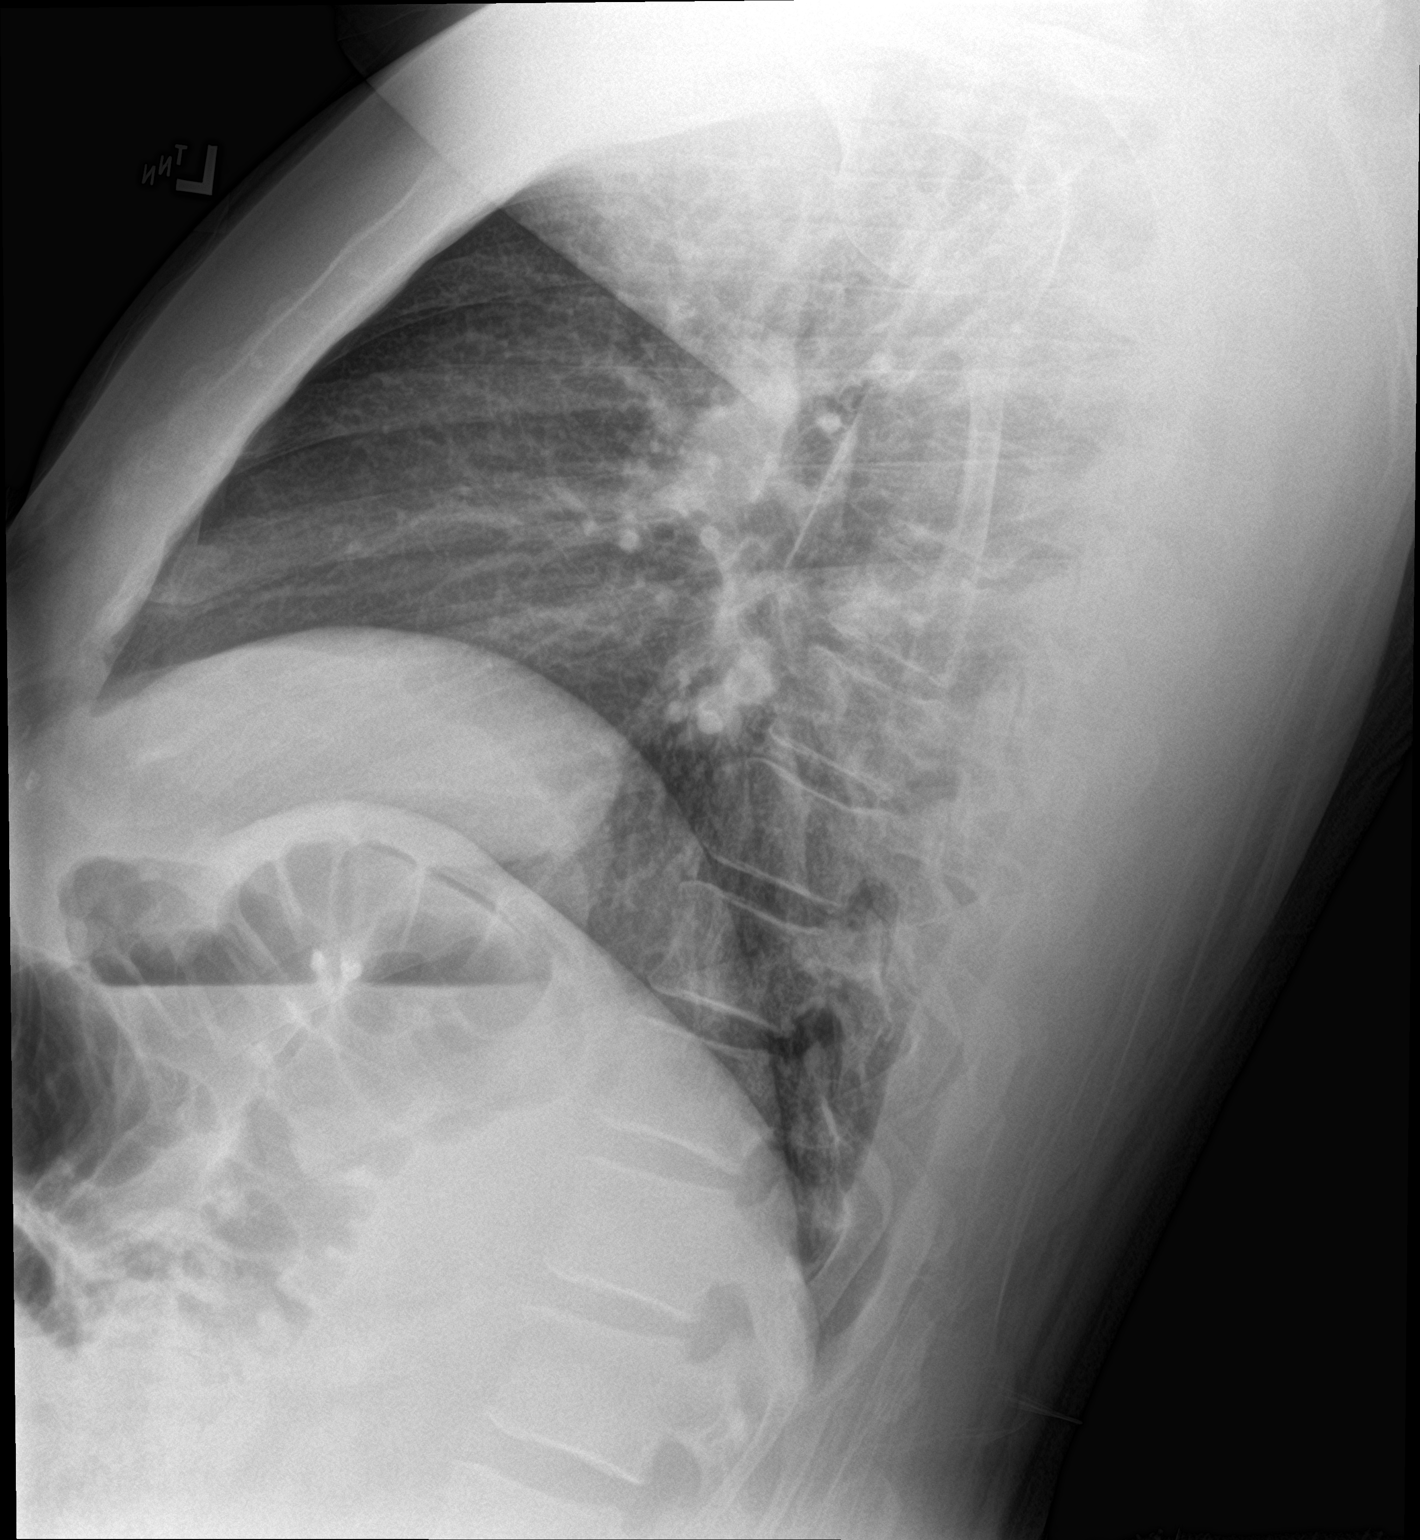

[chest ap]
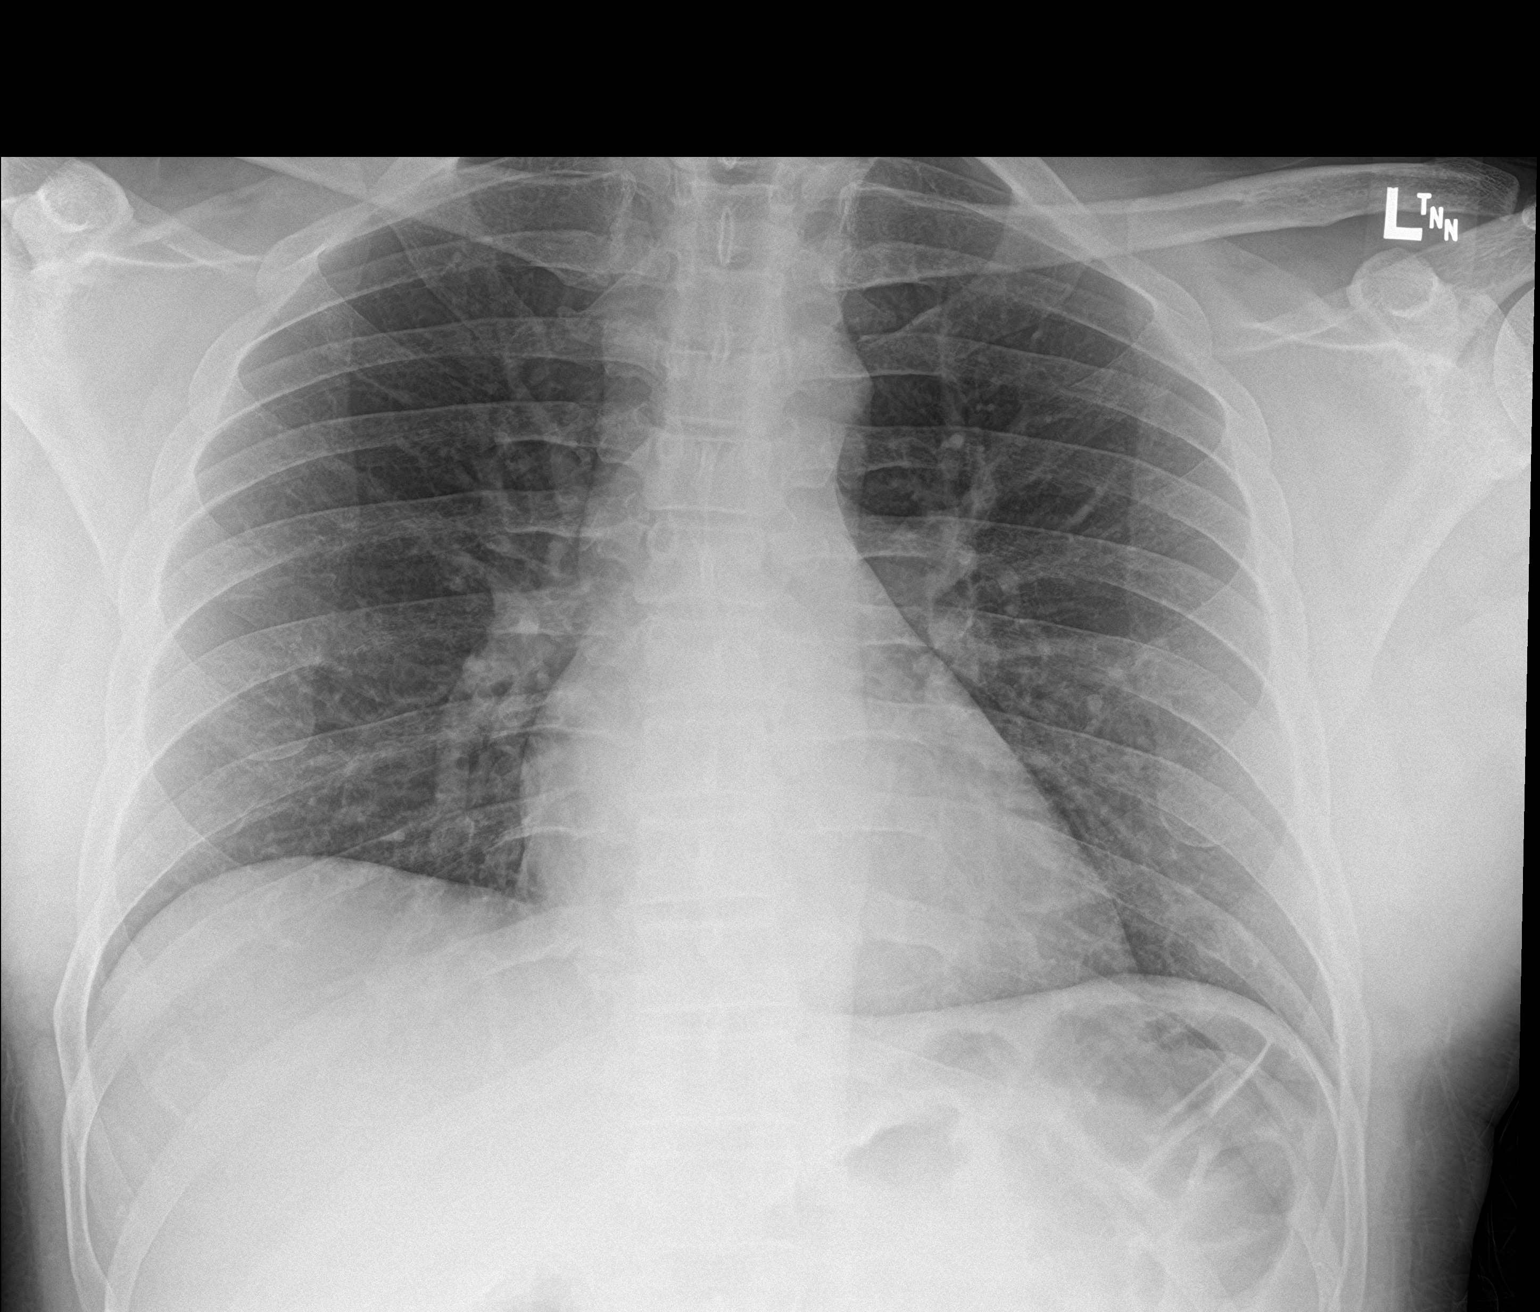

[2 of 2 positions shown; findings below may reference images not displayed]

FINDINGS: The heart size and mediastinal contours are within normal limits.
Both lungs are clear. The visualized skeletal structures are
unremarkable.
IMPRESSION: No active cardiopulmonary disease.

## 2021-11-25 ENCOUNTER — Ambulatory Visit
Admission: EM | Admit: 2021-11-25 | Discharge: 2021-11-25 | Disposition: A | Discharge status: Home / Self Care | Payer: BLUE CROSS/BLUE SHIELD | Attending: Emergency Medicine | Admitting: Emergency Medicine

## 2021-11-25 ENCOUNTER — Encounter: Payer: Self-pay | Admitting: Emergency Medicine

## 2021-11-25 ENCOUNTER — Ambulatory Visit (INDEPENDENT_AMBULATORY_CARE_PROVIDER_SITE_OTHER)
Admit: 2021-11-25 | Discharge: 2021-11-25 | Disposition: A | Discharge status: Home / Self Care | Payer: BLUE CROSS/BLUE SHIELD | Attending: Emergency Medicine | Admitting: Emergency Medicine

## 2021-11-25 DIAGNOSIS — N50812 Left testicular pain: Secondary | ICD-10-CM | POA: Insufficient documentation

## 2021-11-25 DIAGNOSIS — N50811 Right testicular pain: Secondary | ICD-10-CM

## 2021-11-25 DIAGNOSIS — N451 Epididymitis: Secondary | ICD-10-CM

## 2021-11-25 LAB — URINALYSIS, MICROSCOPIC (REFLEX): Squamous Epithelial / HPF: NONE SEEN (ref 0–5)

## 2021-11-25 LAB — URINALYSIS, ROUTINE W REFLEX MICROSCOPIC
Bilirubin Urine: NEGATIVE
Glucose, UA: NEGATIVE mg/dL
Ketones, ur: NEGATIVE mg/dL
Leukocytes,Ua: NEGATIVE
Nitrite: NEGATIVE
Protein, ur: NEGATIVE mg/dL
Specific Gravity, Urine: 1.025 (ref 1.005–1.030)
pH: 5.5 (ref 5.0–8.0)

## 2021-11-25 MED ORDER — DOXYCYCLINE HYCLATE 100 MG PO CAPS: 100.0000 mg | ORAL_CAPSULE | Freq: Two times a day (BID) | ORAL | 0 refills | Status: AC

## 2021-11-25 NOTE — Discharge Instructions (Addendum)
Your ultrasound showed evidence of epididymitis which could be causing your swelling and pain.  This is treated with antibiotics.  Take the doxycycline twice daily with food for 7 days for treatment of your epididymitis.  Wear supportive underwear to take weight off of your scrotum and tension off of your epididymis to see if this helps with your discomfort.  You can also take over-the-counter Aleve or ibuprofen to help with pain.  If your symptoms do not prove, or they worsen, either return for reevaluation, see your primary care provider, or follow-up with urology.

## 2021-11-25 NOTE — ED Triage Notes (Signed)
Patient c/o lower abdominal pain and bilateral testicle pain that started 2-3 days ago.  Patient reports some swelling in his testicles.  Patient denies any urinary symptoms. Patient denies any N/V/D.  Patient denies fevers or chills.

## 2021-11-25 NOTE — ED Provider Notes (Addendum)
[facility]    CSN: [id number] Arrival date & time: 11/25/21  1115      History   Chief Complaint Chief Complaint  Patient presents with   Abdominal Pain   Testicle Pain    HPI Ryan Sawyer is a 41 y.o. male.   HPI  41 year old male here for evaluation of lower abdominal pain and bilateral testicular pain and swelling.  Patient reports that his symptoms began approximately 3 days ago in the pain is not associate with any fever, painful urination, urinary urgency or frequency, bloody urine or cloudy urine, nausea or vomiting, redness to the scrotum, or injury.  He denies any concern for sexual transmitted infections or penile discharge.  Past Medical History:  Diagnosis Date   Club foot of both lower extremities    Detached retina    right eye   GERD (gastroesophageal reflux disease)    Vertigo     Patient Active Problem List   Diagnosis Date Noted   Patellar tendon rupture, left, initial encounter 10/01/2018   Ganglion cyst 12/24/2017   Muscle twitch 12/24/2017    Past Surgical History:  Procedure Laterality Date   EYE SURGERY     FOOT SURGERY     PATELLAR TENDON REPAIR Left 10/01/2018   Procedure: PATELLA TENDON REPAIR;  Surgeon: Juanell Fairly, MD;  Location: [facility] OR[facility];  Service: Orthopedics;  Laterality: Left;       Home Medications    Prior to Admission medications   Medication Sig Start Date End Date Taking? Authorizing Provider  doxycycline (VIBRAMYCIN) 100 MG capsule Take 1 capsule (100 mg total) by mouth 2 (two) times daily for 7 days. 11/25/21 12/02/21 Yes Becky Augusta, NP  metoprolol succinate (TOPROL-XL) 25 MG 24 hr tablet Take 1 tablet (25 mg total) by mouth daily. 10/03/18  Yes Sudini, Wardell Heath, MD  omeprazole (PRILOSEC) 20 MG capsule Take 1 capsule (20 mg total) by mouth daily. 04/14/17  Yes Tommie Sams, DO    Family History Family History  Problem Relation Age of Onset   Diabetes Mother    Hyperlipidemia Mother     Hypertension Mother    Healthy Father     Social History Social History   Tobacco Use   Smoking status: Never   Smokeless tobacco: Never  Vaping Use   Vaping Use: Never used  Substance Use Topics   Alcohol use: No   Drug use: No     Allergies   Patient has no known allergies.   Review of Systems Review of Systems  Constitutional:  Negative for fever.  Gastrointestinal:  Positive for abdominal pain. Negative for nausea and vomiting.  Genitourinary:  Positive for scrotal swelling and testicular pain. Negative for dysuria, frequency, hematuria, penile discharge, penile pain, penile swelling and urgency.     Physical Exam Triage Vital Signs ED Triage Vitals  Enc Vitals Group     BP 11/25/21 1123 127/82     Pulse Rate 11/25/21 1123 67     Resp 11/25/21 1123 15     Temp 11/25/21 1123 98.4 F (36.9 C)     Temp Source 11/25/21 1123 Oral     SpO2 11/25/21 1123 100 %     Weight 11/25/21 1120 220 lb (99.8 kg)     Height 11/25/21 1120 5\' 9"  (1.753 m)     Head Circumference --      Peak Flow --      Pain Score 11/25/21 1119 5     Pain Loc --  Pain Edu? --      Excl. in [place]? --    No data found.  Updated Vital Signs BP 127/82 (BP Location: Left Arm)   Pulse 67   Temp 98.4 F (36.9 C) (Oral)   Resp 15   Ht 5\' 9"  (1.753 m)   Wt 220 lb (99.8 kg)   SpO2 100%   BMI 32.49 kg/m   Visual Acuity Right Eye Distance:   Left Eye Distance:   Bilateral Distance:    Right Eye Near:   Left Eye Near:    Bilateral Near:     Physical Exam Vitals and nursing note reviewed.  Constitutional:      Appearance: Normal appearance. He is not ill-appearing.  HENT:     Head: Normocephalic and atraumatic.  Cardiovascular:     Rate and Rhythm: Normal rate and regular rhythm.     Pulses: Normal pulses.     Heart sounds: Normal heart sounds. No murmur heard.    No friction rub. No gallop.  Pulmonary:     Effort: Pulmonary effort is normal.     Breath sounds: Normal breath  sounds. No wheezing, rhonchi or rales.  Abdominal:     General: Abdomen is flat. Bowel sounds are normal.     Palpations: Abdomen is soft.     Tenderness: There is no abdominal tenderness. There is no guarding or rebound.  Genitourinary:    Penis: Normal.      Testes: Normal.  Skin:    General: Skin is warm and dry.     Capillary Refill: Capillary refill takes less than 2 seconds.     Findings: No erythema.  Neurological:     General: No focal deficit present.     Mental Status: He is alert and oriented to person, place, and time.  Psychiatric:        Mood and Affect: Mood normal.        Behavior: Behavior normal.        Thought Content: Thought content normal.        Judgment: Judgment normal.      UC Treatments / Results  Labs (all labs ordered are listed, but only abnormal results are displayed) Labs Reviewed  URINALYSIS, ROUTINE W REFLEX MICROSCOPIC - Abnormal; Notable for the following components:      Result Value   Hgb urine dipstick TRACE (*)    All other components within normal limits  URINALYSIS, MICROSCOPIC (REFLEX) - Abnormal; Notable for the following components:   Bacteria, UA RARE (*)    All other components within normal limits    EKG   Radiology US SCROTUM W/DOPPLER  Result Date: 11/25/2021 CLINICAL DATA:  Bilateral testicular pain and swelling EXAM: SCROTAL ULTRASOUND DOPPLER ULTRASOUND OF THE TESTICLES TECHNIQUE: Complete ultrasound examination of the testicles, epididymis, and other scrotal structures was performed. Color and spectral Doppler ultrasound were also utilized to evaluate blood flow to the testicles. COMPARISON:  None Available. FINDINGS: Right testicle Measurements: 4.0 by 2.3 by 2.9 cm. No mass or microlithiasis visualized. Subtle mottled echogenicity likely attributed to tubular sclerosis. Left testicle Measurements: 3.9 by 2.3 by 2.9 cm. No mass or microlithiasis visualized. Subtle mottled echogenicity likely related to tubular  sclerosis. Right epididymis:  Normal in size and appearance. Left epididymis: Mild asymmetric epididymal enlargement raising the possibility of low-grade epididymitis. This is especially notable along the epididymal tail. Hydrocele:  None visualized. Varicocele:  None visualized. Pulsed Doppler interrogation of both testes demonstrates normal low resistance arterial and  venous waveforms bilaterally. IMPRESSION: 1. Mild asymmetric enlargement of the left epididymal tail raising the possibility of low-grade epididymitis. 2. Subtle mottled echogenicity in both testes likely attributed to tubular sclerosis. Electronically Signed   By: Gaylyn Rong M.D.   On: 11/25/2021 13:54    Procedures Procedures (including critical care time)  Medications Ordered in UC Medications - No data to display  Initial Impression / Assessment and Plan / UC Course  I have reviewed the triage vital signs and the nursing notes.  Pertinent labs & imaging results that were available during my care of the patient were reviewed by me and considered in my medical decision making (see chart for details).   Patient is nontoxic-appearing 41 year old male here for evaluation of lower abdominal pain with bilateral testicle swelling and pain that has been going on for the past 3 days.  No associated urinary symptoms, GI symptoms, or fever.  On exam patient has no tenderness to his abdomen and he has no tenderness with palpation of his testicles.  Both testicles are smooth in texture and appear normal in size.  The scrotum itself is free of edema or erythema.  The epididymis complex of both testicles does feel boggy the patient denies any discomfort with palpation.  Suspicious this patient may have epididymitis.  I will check urinalysis as well as order a scrotal ultrasound.  Urinalysis shows trace hemoglobin but is negative for leukocyte esterase, nitrates, or protein.  His clear and yellow in appearance with normal specific gravity.   Reflex microscopy shows rare bacteria but no WBCs present.  Radiology impression scrotal ultrasound states mild asymmetric enlargement of the left epididymal tail raising the possibility of low-grade epididymitis.  Subtle mottled echogenicity in both testes likely attributed to tubular sclerosis.  I will discharge patient home with a diagnosis of epididymitis and treat him with doxycycline twice daily for 7 days.   Final Clinical Impressions(s) / UC Diagnoses   Final diagnoses:  Epididymitis     Discharge Instructions      Your ultrasound showed evidence of epididymitis which could be causing your swelling and pain.  This is treated with antibiotics.  Take the doxycycline twice daily with food for 7 days for treatment of your epididymitis.  Wear supportive underwear to take weight off of your scrotum and tension off of your epididymis to see if this helps with your discomfort.  You can also take over-the-counter Aleve or ibuprofen to help with pain.  If your symptoms do not prove, or they worsen, either return for reevaluation, see your primary care provider, or follow-up with urology.     ED Prescriptions     Medication Sig Dispense Auth. Provider   doxycycline (VIBRAMYCIN) 100 MG capsule Take 1 capsule (100 mg total) by mouth 2 (two) times daily for 7 days. 14 capsule Becky Augusta, NP      PDMP not reviewed this encounter.   Becky Augusta, NP 11/25/21 1415    Becky Augusta, NP 11/25/21 [phone]
